# Patient Record
Sex: Male | Born: 1980 | Race: White | Hispanic: No | Marital: Married
Health system: Midwestern US, Academic
[De-identification: ages and names within clinical notes are randomized; demographics above are authoritative.]

## PROBLEM LIST (undated history)

## (undated) DIAGNOSIS — M5126 Other intervertebral disc displacement, lumbar region: Secondary | ICD-10-CM

## (undated) DIAGNOSIS — I10 Essential (primary) hypertension: Secondary | ICD-10-CM

## (undated) DIAGNOSIS — F419 Anxiety disorder, unspecified: Secondary | ICD-10-CM

## (undated) DIAGNOSIS — F32A Depression, unspecified: Secondary | ICD-10-CM

## (undated) DIAGNOSIS — F329 Major depressive disorder, single episode, unspecified: Secondary | ICD-10-CM

## (undated) DIAGNOSIS — J301 Allergic rhinitis due to pollen: Secondary | ICD-10-CM

## (undated) DIAGNOSIS — K219 Gastro-esophageal reflux disease without esophagitis: Secondary | ICD-10-CM

## (undated) HISTORY — PX: SHOULDER ARTHROCENTESIS: SUR47

## (undated) HISTORY — DX: Depression, unspecified: F32.A

## (undated) HISTORY — DX: Essential (primary) hypertension: I10

## (undated) HISTORY — PX: APPENDECTOMY: SHX54

## (undated) HISTORY — DX: Gastro-esophageal reflux disease without esophagitis: K21.9

## (undated) HISTORY — DX: Allergic rhinitis due to pollen: J30.1

## (undated) HISTORY — DX: Other intervertebral disc displacement, lumbar region: M51.26

## (undated) HISTORY — DX: Anxiety disorder, unspecified: F41.9

## (undated) HISTORY — DX: Major depressive disorder, single episode, unspecified: F32.9

## (undated) HISTORY — PX: KNEE SURGERY: SHX244

---

## 2011-01-10 DIAGNOSIS — F988 Other specified behavioral and emotional disorders with onset usually occurring in childhood and adolescence: Secondary | ICD-10-CM | POA: Insufficient documentation

## 2012-10-15 DIAGNOSIS — E663 Overweight: Secondary | ICD-10-CM | POA: Insufficient documentation

## 2014-06-07 DIAGNOSIS — I10 Essential (primary) hypertension: Secondary | ICD-10-CM | POA: Insufficient documentation

## 2014-06-07 DIAGNOSIS — E785 Hyperlipidemia, unspecified: Secondary | ICD-10-CM | POA: Insufficient documentation

## 2016-01-23 DIAGNOSIS — Z0000 Encounter for general adult medical examination without abnormal findings: Secondary | ICD-10-CM | POA: Insufficient documentation

## 2016-01-23 DIAGNOSIS — M79672 Pain in left foot: Secondary | ICD-10-CM | POA: Insufficient documentation

## 2016-07-03 DIAGNOSIS — F330 Major depressive disorder, recurrent, mild: Secondary | ICD-10-CM | POA: Insufficient documentation

## 2018-05-01 ENCOUNTER — Other Ambulatory Visit: Payer: Self-pay

## 2018-05-01 ENCOUNTER — Encounter (HOSPITAL_COMMUNITY): Payer: Self-pay | Admitting: Psychiatry

## 2018-05-01 ENCOUNTER — Ambulatory Visit (HOSPITAL_COMMUNITY): Payer: Self-pay | Admitting: Psychiatry

## 2018-05-01 ENCOUNTER — Ambulatory Visit (INDEPENDENT_AMBULATORY_CARE_PROVIDER_SITE_OTHER): Payer: BLUE CROSS/BLUE SHIELD | Admitting: Psychiatry

## 2018-05-01 VITALS — BP 124/78 | HR 83 | Ht 71.0 in | Wt 180.0 lb

## 2018-05-01 DIAGNOSIS — F331 Major depressive disorder, recurrent, moderate: Secondary | ICD-10-CM

## 2018-05-01 DIAGNOSIS — F411 Generalized anxiety disorder: Secondary | ICD-10-CM

## 2018-05-01 DIAGNOSIS — F900 Attention-deficit hyperactivity disorder, predominantly inattentive type: Secondary | ICD-10-CM | POA: Diagnosis not present

## 2018-05-01 DIAGNOSIS — F5102 Adjustment insomnia: Secondary | ICD-10-CM

## 2018-05-01 MED ORDER — BUPROPION HCL ER (XL) 300 MG PO TB24: 300.0000 mg | ORAL_TABLET | Freq: Every day | ORAL | 0 refills | Status: DC

## 2018-05-01 MED ORDER — BUPROPION HCL ER (XL) 150 MG PO TB24: 150.0000 mg | ORAL_TABLET | Freq: Every day | ORAL | 0 refills | Status: DC

## 2018-05-01 NOTE — Progress Notes (Signed)
Psychiatric Initial Adult Assessment   Patient Identification: James Sloan MRN:  [id number] Date of Evaluation:  05/01/2018 Referral Source: self referral Chief Complaint:   Chief Complaint    Establish Care; Depression; Anxiety     Visit Diagnosis:    ICD-10-CM   1. Major depressive disorder, recurrent episode, moderate (HCC) F33.1   2. GAD (generalized anxiety disorder) F41.1   3. Adjustment insomnia F51.02   4. Attention deficit hyperactivity disorder (ADHD), predominantly inattentive type F90.0     History of Present Illness:  37 years old white married male. Relocated from [place] , developing care. Had psychiatrist in [place] and diagnosed with depression, anxiety, adhd and insomnia  Wife has a job in [place] for which he had moved he has not yet found a job. He has 2 kids and takes care of them, he feels balanced on the current medication of Wellbutrin 450 mg takes trazodone for sleep. He has been diagnosed with ADHD when he was in college and he has gone through psychological testing has been on Vyvanse for the last 3 years since her Vyvanse dose to 50 mg does help that inattention otherwise he gets distracted was having difficulty in learning and college and falling grades.  History suggestive of around in 2007 he has suffered from depression that lasted for more than 2 weeks including decreased energy decreased interest decreased motivation feeling thoughts down and depressed but not hopeless to the point of suicidal thoughts or having crying spells   He does endorse worries in the past excessive and diagnoses generalized anxiety possibility and has been on different medications SSRI but now he is on Klonopin when necessary and also Vistaril when necessary face that SSRIs did not include and it would cause side effects included decreased libido  He feels balanced and the Wellbutrin. He does understand Vyvanse can affect by its stimulating effect to anxiety and blood  pressure.  Duration more than 10 years  Modifying factor wife and kids  Activity factors as of now no job. Relocation.  Severity of depression is fair and balanced   No side effects reported  not using vyvanse or klonopine regularly Has one month refill on all meds for now    Associated Signs/Symptoms: Depression Symptoms:  anxiety, (Hypo) Manic Symptoms:  Distractibility, Anxiety Symptoms:  Excessive Worry, Psychotic Symptoms:  denies PTSD Symptoms: NA  Past Psychiatric History: depression , anxiety  Previous Psychotropic Medications: Yes   Substance Abuse History in the last 12 months:  No.  Consequences of Substance Abuse: NA  Past Medical History: History reviewed. No pertinent past medical history. History reviewed. No pertinent surgical history.  Family Psychiatric History: denies   Family History: History reviewed. No pertinent family history.  Social History:   Social History   Socioeconomic History  . Marital status: Married    Spouse name: Not on file  . Number of children: Not on file  . Years of education: Not on file  . Highest education level: Not on file  Occupational History  . Not on file  Social Needs  . Financial resource strain: Not on file  . Food insecurity:    Worry: Not on file    Inability: Not on file  . Transportation needs:    Medical: Not on file    Non-medical: Not on file  Tobacco Use  . Smoking status: Not on file  Substance and Sexual Activity  . Alcohol use: Yes    Alcohol/week: 1.2 - 2.4 oz  Types: 1 - 2 Glasses of wine, 1 - 2 Cans of beer per week    Comment: a few drinks per week  . Drug use: Not Currently    Types: Marijuana  . Sexual activity: Not on file  Lifestyle  . Physical activity:    Days per week: Not on file    Minutes per session: Not on file  . Stress: Not on file  Relationships  . Social connections:    Talks on phone: Not on file    Gets together: Not on file    Attends religious  service: Not on file    Active member of club or organization: Not on file    Attends meetings of clubs or organizations: Not on file    Relationship status: Not on file  Other Topics Concern  . Not on file  Social History Narrative  . Not on file    Additional Social History: grew up with parents. Fine . No trauma. Married for 9 years   Allergies:   Allergies  Allergen Reactions  . Iodinated Diagnostic Agents Hives  . Iodine Hives    Metabolic Disorder Labs: No results found for: HGBA1C, MPG No results found for: PROLACTIN No results found for: CHOL, TRIG, HDL, CHOLHDL, VLDL, LDLCALC   Current Medications: Current Outpatient Medications  Medication Sig Dispense Refill  . buPROPion (WELLBUTRIN XL) 150 MG 24 hr tablet Take 1 tablet (150 mg total) by mouth daily. 30 tablet 0  . buPROPion (WELLBUTRIN XL) 300 MG 24 hr tablet Take 1 tablet (300 mg total) by mouth daily. 30 tablet 0  . CANNABIDIOL PO Take 15 mg by mouth 1 day or 1 dose.    . clonazePAM (KLONOPIN) 0.5 MG tablet Take 0.5 mg by mouth as needed for anxiety.    . hydrOXYzine (ATARAX/VISTARIL) 25 MG tablet Take 25 mg by mouth as needed.    Marland Kitchen lisdexamfetamine (VYVANSE) 30 MG capsule Take by mouth.    . losartan (COZAAR) 100 MG tablet Take 100 mg by mouth daily.    . meloxicam (MOBIC) 15 MG tablet Take 15 mg by mouth daily as needed.  0  . pantoprazole (PROTONIX) 40 MG tablet Take one tablet by mouth every day    . traZODone (DESYREL) 50 MG tablet Take by mouth.     No current facility-administered medications for this visit.     Neurologic: Headache: No Seizure: No Paresthesias:No  Musculoskeletal: Strength & Muscle Tone: within normal limits Gait & Station: normal Patient leans: no lean  Psychiatric Specialty Exam: Review of Systems  Cardiovascular: Negative for chest pain.  Psychiatric/Behavioral: Negative for suicidal ideas.    Blood pressure 124/78, pulse 83, height 5\' 11"  (1.803 m), weight 180 lb  (81.6 kg).Body mass index is 25.1 kg/m.  General Appearance: Casual  Eye Contact:  Fair  Speech:  Slow  Volume:  Normal  Mood:  fair  Affect:  Congruent  Thought Process:  Goal Directed  Orientation:  Full (Time, Place, and Person)  Thought Content:  Rumination  Suicidal Thoughts:  No  Homicidal Thoughts:  No  Memory:  Immediate;   Fair Recent;   Fair  Judgement:  Good  Insight:  Good  Psychomotor Activity:  Normal  Concentration:  Concentration: Fair and Attention Span: Fair  Recall:  [employer] of Knowledge:Fair  Language: Fair  Akathisia:  Negative  Handed:  Right  AIMS (if indicated):    Assets:  Desire for Improvement  ADL's:  Intact  Cognition:  WNL  Sleep:  Fair on meds    Treatment Plan Summary: Medication management and Plan as follows  1. Major depressive disorder, mild in remission: continue wellbutrin will give extra refill. He just filled for one month as well Total is 300 plus 150mg   2. GAD: takes vistaril or klonopine prn. Discussed to avoid using klonopine and if needed can consider zoloft small dose next visit  says he seldom uses klonopine  3. Insomnia: reviewed sleep hygiene. Take prn trazadone  4. ADHD; diagnosed by psychological testing in college and being prescribed vyvase by psychiatrist in [place]. Has one month supply. Encouraged not to take as of now regularly since not working and its effect on anxiety, sleep and possible blood pressure  Discussed to add more water and exercises to the day Reviewed meds, concerns and side effect  he wants to follow 6w with me on the Saturday clinic  can call earlier for concerns or refills  More than 50% time spent in counseling and coordination of care including patient education and review side effects and concerns were addressed     Merian Capron, MD 6/1/201910:14 AM

## 2018-06-09 ENCOUNTER — Other Ambulatory Visit: Payer: Self-pay

## 2018-06-09 ENCOUNTER — Ambulatory Visit (INDEPENDENT_AMBULATORY_CARE_PROVIDER_SITE_OTHER): Payer: BLUE CROSS/BLUE SHIELD | Admitting: Family Medicine

## 2018-06-09 ENCOUNTER — Encounter: Payer: Self-pay | Admitting: Family Medicine

## 2018-06-09 VITALS — BP 110/88 | HR 86 | Temp 98.3°F | Ht 71.0 in | Wt 176.0 lb

## 2018-06-09 DIAGNOSIS — Z7689 Persons encountering health services in other specified circumstances: Secondary | ICD-10-CM | POA: Diagnosis not present

## 2018-06-09 DIAGNOSIS — F419 Anxiety disorder, unspecified: Secondary | ICD-10-CM

## 2018-06-09 DIAGNOSIS — I10 Essential (primary) hypertension: Secondary | ICD-10-CM | POA: Diagnosis not present

## 2018-06-09 DIAGNOSIS — K219 Gastro-esophageal reflux disease without esophagitis: Secondary | ICD-10-CM

## 2018-06-09 DIAGNOSIS — F32A Depression, unspecified: Secondary | ICD-10-CM

## 2018-06-09 DIAGNOSIS — F329 Major depressive disorder, single episode, unspecified: Secondary | ICD-10-CM

## 2018-06-09 DIAGNOSIS — D229 Melanocytic nevi, unspecified: Secondary | ICD-10-CM

## 2018-06-09 MED ORDER — LOSARTAN POTASSIUM 100 MG PO TABS: 100.0000 mg | ORAL_TABLET | Freq: Every day | ORAL | 1 refills | Status: DC

## 2018-06-09 MED ORDER — PANTOPRAZOLE SODIUM 40 MG PO TBEC: 40.0000 mg | DELAYED_RELEASE_TABLET | Freq: Every day | ORAL | 1 refills | Status: DC

## 2018-06-09 NOTE — Progress Notes (Signed)
Patient presents to clinic today to establish care.  SUBJECTIVE: PMH: Pt is a 37 yo male with pmh hx sig for HTN, GERD, anxiety, depression, ADHD, insomnia.  Pt was previously seen in [place].  He moved recently moved to the area.  HTN: -taking losartan 100 mg daily -Checking BP at home typically 110-115/70-85 -Patient trying to stay active.  Went hiking last weekend. -Does not utilize salt  GERD: -Taking Protonix 40 mg daily -Prior to medication noted symptoms of all foods.  anxiety and depression: -Endorses good mood, and good energy.  Sleep is improving. -Has been dealing with depression for the last 11 years. -Patient is taking trazodone 50 mg for sleep. -Recently established with Dr. De Nurse at the behavioral [facility] -Wellbutrin XL 450 mg, Vistaril 25 mg as needed, Klonopin 0.5 mg as needed   Skin concerns: -Patient endorses multiple moles on his back which he is interested in having removed. -They do not itch or bleed.  Allergies: Iodine-hives, throat edema  Social history: Pt is married.  He and his family moved from [place].  Pt has a 11-year-old son who is present with him this visit.  Pt currently stays at home taking care of his son.  Prior to moving patient worked in [employer] for [employer].  Pt denies tobacco or drug use.  Pt endorses having 3 drinks per week.   Past Medical History:  Diagnosis Date  . Anxiety   . Depression   . GERD (gastroesophageal reflux disease)   . Hay fever   . Hypertension     Past Surgical History:  Procedure Laterality Date  . APPENDECTOMY    . KNEE SURGERY Left   . SHOULDER ARTHROCENTESIS Left     Current Outpatient Medications on File Prior to Visit  Medication Sig Dispense Refill  . buPROPion (WELLBUTRIN XL) 150 MG 24 hr tablet Take 1 tablet (150 mg total) by mouth daily. 30 tablet 0  . buPROPion (WELLBUTRIN XL) 300 MG 24 hr tablet Take 1 tablet (300 mg total) by mouth daily. 30  tablet 0  . CANNABIDIOL PO Take 15 mg by mouth 1 day or 1 dose.    . hydrOXYzine (ATARAX/VISTARIL) 25 MG tablet Take 25 mg by mouth as needed.    Marland Kitchen lisdexamfetamine (VYVANSE) 30 MG capsule Take by mouth.    . traZODone (DESYREL) 50 MG tablet Take by mouth.     No current facility-administered medications on file prior to visit.     Allergies  Allergen Reactions  . Iodinated Diagnostic Agents Hives  . Iodine Hives    Family History  Problem Relation Age of Onset  . Hyperlipidemia Father   . Cancer Maternal Grandmother   . ADD / ADHD Maternal Grandmother   . Cancer Maternal Grandfather   . ADD / ADHD Maternal Grandfather   . Hyperlipidemia Paternal Grandmother   . Stroke Paternal Grandfather   . Hyperlipidemia Paternal Grandfather     Social History   Socioeconomic History  . Marital status: Married    Spouse name: Not on file  . Number of children: Not on file  . Years of education: Not on file  . Highest education level: Not on file  Occupational History  . Not on file  Social Needs  . Financial resource strain: Not on file  . Food insecurity:    Worry: Not on file    Inability: Not on file  . Transportation needs:    Medical: Not on file  Non-medical: Not on file  Tobacco Use  . Smoking status: Never Smoker  . Smokeless tobacco: Never Used  Substance and Sexual Activity  . Alcohol use: Yes    Alcohol/week: 1.2 - 2.4 oz    Types: 1 - 2 Glasses of wine, 1 - 2 Cans of beer per week    Comment: a few drinks per week  . Drug use: Not Currently    Types: Marijuana  . Sexual activity: Yes  Lifestyle  . Physical activity:    Days per week: Not on file    Minutes per session: Not on file  . Stress: Not on file  Relationships  . Social connections:    Talks on phone: Not on file    Gets together: Not on file    Attends religious service: Not on file    Active member of club or organization: Not on file    Attends meetings of clubs or organizations: Not on  file    Relationship status: Not on file  . Intimate partner violence:    Fear of current or ex partner: Not on file    Emotionally abused: Not on file    Physically abused: Not on file    Forced sexual activity: Not on file  Other Topics Concern  . Not on file  Social History Narrative  . Not on file    ROS General: Denies fever, chills, night sweats, changes in weight, changes in appetite HEENT: Denies headaches, ear pain, changes in vision, rhinorrhea, sore throat CV: Denies CP, palpitations, SOB, orthopnea Pulm: Denies SOB, cough, wheezing GI: Denies abdominal pain, nausea, vomiting, diarrhea, constipation GU: Denies dysuria, hematuria, frequency, vaginal discharge Msk: Denies muscle cramps, joint pains Neuro: Denies weakness, numbness, tingling Skin: Denies rashes, bruising Psych: Denies hallucinations  + anxiety and depression  BP 110/88 (BP Location: Left Arm, Patient Position: Sitting, Cuff Size: Normal)   Pulse 86   Temp 98.3 F (36.8 C) (Oral)   Ht 5\' 11"  (1.803 m)   Wt 176 lb (79.8 kg)   SpO2 98%   BMI 24.55 kg/m   Physical Exam Gen. Pleasant, well developed, well-nourished, in NAD HEENT - [place]/AT, PERRL, no scleral icterus, no nasal drainage, pharynx without erythema or exudate.  TMs normal bilaterally.  No cervical lymphadenopathy. Lungs: no use of accessory muscles,  CTAB, no wheezes, rales or rhonchi Cardiovascular: RRR, No r/g/m, no peripheral edema Abdomen: BS present, soft, nontender, nondistended Neuro:  A&Ox3, CN II-XII intact, normal gait Skin:  Warm, dry, intact, no lesions.  Several nevi on back, R flank nevus with irregular border, <88mm  No results found for this or any previous visit (from the past 2160 hour(s)).  Assessment/Plan: Essential hypertension -Controlled -Continue losartan 100 mg daily.  Refill provided -Continue lifestyle modifications -Continue checking BP at home  Gastroesophageal reflux disease, esophagitis presence not  specified -Continue pantoprazole 40 mg daily.  refill provided -Given a list of foods to avoid that are known to cause GERD symptoms  Nevus  - Plan: Ambulatory referral to Dermatology  Anxiety and depression -Continue Wellbutrin XL 450 mg, Atarax 25 mg as needed, Klonopin 0.5 mg as needed, trazodone 50 mg nightly -Continue to follow up with Dr. De Nurse  -PHQ 9 score 6 -Gad 7 score 3  Encounter to establish care -We reviewed the PMH, PSH, FH, SH, Meds and Allergies. -We provided refills for any medications we will prescribe as needed. -We addressed current concerns per orders and patient instructions. -We have asked for  records for pertinent exams, studies, vaccines and notes from previous providers. -We have advised patient to follow up per instructions below.  Follow-up in the next few weeks to months for CPE  Grier Mitts, MD

## 2018-06-09 NOTE — Patient Instructions (Signed)
Food Choices for Gastroesophageal Reflux Disease, Adult When you have gastroesophageal reflux disease (GERD), the foods you eat and your eating habits are very important. Choosing the right foods can help ease the discomfort of GERD. Consider working with a diet and nutrition specialist (dietitian) to help you make healthy food choices. What general guidelines should I follow? Eating plan  Choose healthy foods low in fat, such as fruits, vegetables, whole grains, low-fat dairy products, and lean meat, fish, and poultry.  Eat frequent, small meals instead of three large meals each day. Eat your meals slowly, in a relaxed setting. Avoid bending over or lying down until 2-3 hours after eating.  Limit high-fat foods such as fatty meats or fried foods.  Limit your intake of oils, butter, and shortening to less than 8 teaspoons each day.  Avoid the following: ? Foods that cause symptoms. These may be different for different people. Keep a food diary to keep track of foods that cause symptoms. ? Alcohol. ? Drinking large amounts of liquid with meals. ? Eating meals during the 2-3 hours before bed.  Cook foods using methods other than frying. This may include baking, grilling, or broiling. Lifestyle   Maintain a healthy weight. Ask your health care provider what weight is healthy for you. If you need to lose weight, work with your health care provider to do so safely.  Exercise for at least 30 minutes on 5 or more days each week, or as told by your health care provider.  Avoid wearing clothes that fit tightly around your waist and chest.  Do not use any products that contain nicotine or tobacco, such as cigarettes and e-cigarettes. If you need help quitting, ask your health care provider.  Sleep with the head of your bed raised. Use a wedge under the mattress or blocks under the bed frame to raise the head of the bed. What foods are not recommended? The items listed may not be a complete  list. Talk with your dietitian about what dietary choices are best for you. Grains Pastries or quick breads with added fat. French toast. Vegetables Deep fried vegetables. French fries. Any vegetables prepared with added fat. Any vegetables that cause symptoms. For some people this may include tomatoes and tomato products, chili peppers, onions and garlic, and horseradish. Fruits Any fruits prepared with added fat. Any fruits that cause symptoms. For some people this may include citrus fruits, such as oranges, grapefruit, pineapple, and lemons. Meats and other protein foods High-fat meats, such as fatty beef or pork, hot dogs, ribs, ham, sausage, salami and bacon. Fried meat or protein, including fried fish and fried chicken. Nuts and nut butters. Dairy Whole milk and chocolate milk. Sour cream. Cream. Ice cream. Cream cheese. Milk shakes. Beverages Coffee and tea, with or without caffeine. Carbonated beverages. Sodas. Energy drinks. Fruit juice made with acidic fruits (such as orange or grapefruit). Tomato juice. Alcoholic drinks. Fats and oils Butter. Margarine. Shortening. Ghee. Sweets and desserts Chocolate and cocoa. Donuts. Seasoning and other foods Pepper. Peppermint and spearmint. Any condiments, herbs, or seasonings that cause symptoms. For some people, this may include curry, hot sauce, or vinegar-based salad dressings. Summary  When you have gastroesophageal reflux disease (GERD), food and lifestyle choices are very important to help ease the discomfort of GERD.  Eat frequent, small meals instead of three large meals each day. Eat your meals slowly, in a relaxed setting. Avoid bending over or lying down until 2-3 hours after eating.  Limit high-fat   foods such as fatty meat or fried foods. This information is not intended to replace advice given to you by your health care provider. Make sure you discuss any questions you have with your health care provider. Document Released:  11/17/2005 Document Revised: 11/18/2016 Document Reviewed: 11/18/2016 Elsevier Interactive Patient Education  2018 [employer]  Mole A mole is a colored (pigmented) growth on the skin. Moles are very common. They are usually harmless, but some moles can become cancerous over time. What are the causes? Moles occur when pigmented skin cells grow together in clusters instead of spreading out in the skin as they normally do. The reason why the skin cells grow together in clusters is not known. What are the signs or symptoms? A mole may be:  [place] or black.  Flat or raised.  Smooth or wrinkled.  How is this diagnosed? A mole is diagnosed with a skin exam. If your health care provider thinks a mole may be cancerous, a piece of the mole will be removed for testing. How is this treated? Treatment is not needed unless a mole is cancerous. If a mole is cancerous, it will be removed. If a mole is causing pain or you do not like the way it looks, you may choose to have it removed. Follow these instructions at home:  Every month, look for new moles and check your existing moles for changes. This is important because a change in a mole can mean that the mole has become cancerous. Look for changes in: ? Size. Look for moles that are more than  in (0.64 cm) wide (in diameter). ? Shape. Look for moles that are not round or oval. ? Borders. Look for moles that are not symmetrical. ? Color. Note that it is normal for moles to get darker during pregnancy or when you take birth control pills.  When you are outdoors, wear sunscreen with SPF 30 (sun protection factor 30) or higher. Reapply the sunscreen every 2-3 hours.  If you have a large number of moles, see a skin doctor (dermatologist) at least one time every year. Contact a health care provider if:  The size, shape, borders, or color of your mole change.  Your mole, or the skin near the mole, becomes painful, sore, red, or swollen.  Your  mole: ? Develops more than one color. ? Itches or bleeds. ? Becomes scaly, sheds skin, or oozes fluid. ? Becomes flat or develops raised areas. ? Becomes hard or soft.  You develop a new mole. This information is not intended to replace advice given to you by your health care provider. Make sure you discuss any questions you have with your health care provider. Document Released: 08/12/2001 Document Revised: 04/30/2016 Document Reviewed: 09/07/2015 Elsevier Interactive Patient Education  [employer].

## 2018-06-12 ENCOUNTER — Encounter (HOSPITAL_COMMUNITY): Payer: Self-pay | Admitting: Psychiatry

## 2018-06-12 ENCOUNTER — Other Ambulatory Visit: Payer: Self-pay

## 2018-06-12 ENCOUNTER — Ambulatory Visit (INDEPENDENT_AMBULATORY_CARE_PROVIDER_SITE_OTHER): Payer: BLUE CROSS/BLUE SHIELD | Admitting: Psychiatry

## 2018-06-12 VITALS — BP 122/84 | Wt 181.6 lb

## 2018-06-12 DIAGNOSIS — F331 Major depressive disorder, recurrent, moderate: Secondary | ICD-10-CM | POA: Diagnosis not present

## 2018-06-12 DIAGNOSIS — F411 Generalized anxiety disorder: Secondary | ICD-10-CM | POA: Diagnosis not present

## 2018-06-12 DIAGNOSIS — F5102 Adjustment insomnia: Secondary | ICD-10-CM

## 2018-06-12 MED ORDER — TRAZODONE HCL 50 MG PO TABS: 50.0000 mg | ORAL_TABLET | Freq: Every day | ORAL | 1 refills | Status: DC

## 2018-06-12 MED ORDER — BUPROPION HCL ER (XL) 300 MG PO TB24: 300.0000 mg | ORAL_TABLET | Freq: Every day | ORAL | 1 refills | Status: DC

## 2018-06-12 NOTE — Progress Notes (Signed)
[facility] Outpatient Follow up visit   Patient Identification: James Sloan MRN:  [id number] Date of Evaluation:  06/12/2018 Referral Source: self referral Chief Complaint:   Chief Complaint    Follow-up; Medication Refill     Visit Diagnosis:    ICD-10-CM   1. Major depressive disorder, recurrent episode, moderate (HCC) F33.1   2. GAD (generalized anxiety disorder) F41.1   3. Adjustment insomnia F51.02     History of Present Illness:  37 years old white married male. Relocated from [place] , developing care. Had psychiatrist in [place] and diagnosed with depression, anxiety, adhd and insomnia  Returns for fu after 6 weeks, doing fair on wellbutrin. Seldom uses vyvanse since not working and helping raise kids for now He wants to lower wellbutrin since he feels stable Not taking klonopine Still takes trazadone says tried to lower but had problems sleeping  Wife has a job in [place] for which he had moved he has not yet found a job. He has 2 kids and takes care of them,\  Duration more than 10 years  Modifying factor wife and kids  Activity factors as of now no job. Relocation.  Severity of depression is fair and balanced   No side effects reported   Substance Abuse History in the last 12 months:  No.  Consequences of Substance Abuse: NA  Past Medical History:  Past Medical History:  Diagnosis Date  . Anxiety   . Depression   . GERD (gastroesophageal reflux disease)   . Hay fever   . Hypertension     Past Surgical History:  Procedure Laterality Date  . APPENDECTOMY    . KNEE SURGERY Left   . SHOULDER ARTHROCENTESIS Left     Family Psychiatric History: denies   Family History:  Family History  Problem Relation Age of Onset  . Hyperlipidemia Father   . Cancer Maternal Grandmother   . ADD / ADHD Maternal Grandmother   . Cancer Maternal Grandfather   . ADD / ADHD Maternal Grandfather   . Hyperlipidemia Paternal Grandmother   . Stroke Paternal  Grandfather   . Hyperlipidemia Paternal Grandfather     Social History:   Social History   Socioeconomic History  . Marital status: Married    Spouse name: wittiney  . Number of children: 2  . Years of education: Not on file  . Highest education level: Bachelor's degree (e.g., BA, AB, BS)  Occupational History  . Not on file  Social Needs  . Financial resource strain: Not hard at all  . Food insecurity:    Worry: Never true    Inability: Never true  . Transportation needs:    Medical: No    Non-medical: No  Tobacco Use  . Smoking status: Never Smoker  . Smokeless tobacco: Never Used  Substance and Sexual Activity  . Alcohol use: Yes    Alcohol/week: 1.2 - 2.4 oz    Types: 1 - 2 Glasses of wine, 1 - 2 Cans of beer per week    Comment: a few drinks per week  . Drug use: Not Currently    Types: Marijuana  . Sexual activity: Yes  Lifestyle  . Physical activity:    Days per week: 3 days    Minutes per session: 60 min  . Stress: Not at all  Relationships  . Social connections:    Talks on phone: Once a week    Gets together: Never    Attends religious service: More than 4 times  per year    Active member of club or organization: Yes    Attends meetings of clubs or organizations: More than 4 times per year    Relationship status: Married  Other Topics Concern  . Not on file  Social History Narrative  . Not on file    Additional Social History: grew up with parents. Fine . No trauma. Married for 9 years   Allergies:   Allergies  Allergen Reactions  . Iodinated Diagnostic Agents Hives  . Iodine Hives    Metabolic Disorder Labs: No results found for: HGBA1C, MPG No results found for: PROLACTIN No results found for: CHOL, TRIG, HDL, CHOLHDL, VLDL, LDLCALC   Current Medications: Current Outpatient Medications  Medication Sig Dispense Refill  . buPROPion (WELLBUTRIN XL) 300 MG 24 hr tablet Take 1 tablet (300 mg total) by mouth daily. 30 tablet 1  .  CANNABIDIOL PO Take 15 mg by mouth 1 day or 1 dose.    . hydrOXYzine (ATARAX/VISTARIL) 25 MG tablet Take 25 mg by mouth as needed.    Marland Kitchen lisinopril-hydrochlorothiazide (PRINZIDE,ZESTORETIC) 10-12.5 MG tablet 10-12.5 tablets daily.    Marland Kitchen losartan (COZAAR) 100 MG tablet Take 1 tablet (100 mg total) by mouth daily. 30 tablet 1  . pantoprazole (PROTONIX) 40 MG tablet Take 1 tablet (40 mg total) by mouth daily. 30 tablet 1  . traZODone (DESYREL) 50 MG tablet Take 1 tablet (50 mg total) by mouth at bedtime. 30 tablet 1  . VYVANSE 50 MG capsule TAKE 1 CAPSULE BY MOUTH EVERY DAY IN THE MORNING  0   No current facility-administered medications for this visit.       Psychiatric Specialty Exam: Review of Systems  Cardiovascular: Negative for palpitations.  Psychiatric/Behavioral: Negative for suicidal ideas.    Blood pressure 122/84, weight 181 lb 9.6 oz (82.4 kg).Body mass index is 25.33 kg/m.  General Appearance: Casual  Eye Contact:  Fair  Speech:  Slow  Volume:  Normal  Mood:  fair  Affect:  Congruent  Thought Process:  Goal Directed  Orientation:  Full (Time, Place, and Person)  Thought Content:  Rumination  Suicidal Thoughts:  No  Homicidal Thoughts:  No  Memory:  Immediate;   Fair Recent;   Fair  Judgement:  Good  Insight:  Good  Psychomotor Activity:  Normal  Concentration:  Concentration: Fair and Attention Span: Fair  Recall:  [employer] of Knowledge:Fair  Language: Fair  Akathisia:  Negative  Handed:  Right  AIMS (if indicated):    Assets:  Desire for Improvement  ADL's:  Intact  Cognition: WNL  Sleep:  Fair on meds    Treatment Plan Summary: Medication management and Plan as follows  1. Major depressive disorder, mild in remission: remains in remission he wants to lower wellbutrin, I explained he may have recurrence of depression. Says stress factors are low Will lower to wellbutrin 300mg  only. Stop the 150mg    2. WUJ:[id number] . Seldom takes vistaril 3. Insomnia:  ongoing without trazadone. Will continue   4. ADHD; diagnosed by psychological testing in college. Seldom takes vyvanse. Says would need when he starts working  More than 50% time spent in counseling and coordination of care including patient education and review side effects and concerns were addressed   Fu 4-6 w or call earlier if symptoms worsen or recur  Merian Capron, MD 7/13/201910:03 AM

## 2018-07-06 ENCOUNTER — Other Ambulatory Visit (HOSPITAL_COMMUNITY): Payer: Self-pay | Admitting: Psychiatry

## 2018-07-13 ENCOUNTER — Ambulatory Visit (INDEPENDENT_AMBULATORY_CARE_PROVIDER_SITE_OTHER): Payer: BLUE CROSS/BLUE SHIELD | Admitting: Family Medicine

## 2018-07-13 ENCOUNTER — Encounter: Payer: Self-pay | Admitting: Family Medicine

## 2018-07-13 VITALS — BP 118/86 | HR 102 | Temp 98.5°F | Ht 71.0 in | Wt 182.0 lb

## 2018-07-13 DIAGNOSIS — Z13220 Encounter for screening for lipoid disorders: Secondary | ICD-10-CM

## 2018-07-13 DIAGNOSIS — I10 Essential (primary) hypertension: Secondary | ICD-10-CM | POA: Diagnosis not present

## 2018-07-13 DIAGNOSIS — Z131 Encounter for screening for diabetes mellitus: Secondary | ICD-10-CM

## 2018-07-13 DIAGNOSIS — Z0000 Encounter for general adult medical examination without abnormal findings: Secondary | ICD-10-CM

## 2018-07-13 NOTE — Progress Notes (Signed)
Subjective:  Pt is accompanied by his 2 young sons.   James Sloan is a 37 y.o. male and is here for a comprehensive physical exam. The patient reports no problems.  Compliant with losartan 100 mg daily.  Followed by Dr. De Nurse for depression, anxiety, insomnia.  Social History   Socioeconomic History  . Marital status: Married    Spouse name: James Sloan  . Number of children: 2  . Years of education: Not on file  . Highest education level: Bachelor's degree (e.g., BA, AB, BS)  Occupational History  . Not on file  Social Needs  . Financial resource strain: Not hard at all  . Food insecurity:    Worry: Never true    Inability: Never true  . Transportation needs:    Medical: No    Non-medical: No  Tobacco Use  . Smoking status: Never Smoker  . Smokeless tobacco: Never Used  Substance and Sexual Activity  . Alcohol use: Yes    Alcohol/week: 2.0 - 4.0 standard drinks    Types: 1 - 2 Glasses of wine, 1 - 2 Cans of beer per week    Comment: a few drinks per week  . Drug use: Not Currently    Types: Marijuana  . Sexual activity: Yes  Lifestyle  . Physical activity:    Days per week: 3 days    Minutes per session: 60 min  . Stress: Not at all  Relationships  . Social connections:    Talks on phone: Once a week    Gets together: Never    Attends religious service: More than 4 times per year    Active member of club or organization: Yes    Attends meetings of clubs or organizations: More than 4 times per year    Relationship status: Married  . Intimate partner violence:    Fear of current or ex partner: No    Emotionally abused: No    Physically abused: No    Forced sexual activity: No  Other Topics Concern  . Not on file  Social History Narrative  . Not on file   Health Maintenance  Topic Date Due  . HIV Screening  02/10/1995  . INFLUENZA VACCINE  07/01/2018  . TETANUS/TDAP  05/31/2023    The following portions of the patient's history were reviewed and  updated as appropriate: allergies, current medications, past family history, past medical history, past social history, past surgical history and problem list.  Review of Systems A comprehensive review of systems was negative.   Objective:    BP 118/86 (BP Location: Left Arm, Patient Position: Sitting, Cuff Size: Normal)   Pulse (!) 102   Temp 98.5 F (36.9 C) (Oral)   Ht 5\' 11"  (1.803 m)   Wt 182 lb (82.6 kg)   SpO2 98%   BMI 25.38 kg/m  General appearance: alert, cooperative, appears stated age and no distress Head: Normocephalic, without obvious abnormality, atraumatic Eyes: conjunctivae/corneas clear. PERRL, EOM's intact. Fundi benign. Ears: normal TM's and external ear canals both ears Nose: Nares normal. Septum midline. Mucosa normal. No drainage or sinus tenderness. Throat: lips, mucosa, and tongue normal; teeth and gums normal Neck: no adenopathy, no carotid bruit, no JVD, supple, symmetrical, trachea midline and thyroid not enlarged, symmetric, no tenderness/mass/nodules Lungs: clear to auscultation bilaterally Heart: regular rate and rhythm, S1, S2 normal, no murmur, click, rub or gallop Abdomen: soft, non-tender; bowel sounds normal; no masses,  no organomegaly Extremities: extremities normal, atraumatic, no cyanosis or  edema Skin: Skin color, texture, turgor normal. No rashes or lesions Neurologic: Alert and oriented X 3, normal strength and tone. Normal symmetric reflexes. Normal coordination and gait    Assessment:    Healthy male exam.      Plan:     Anticipatory guidance given including wearing seatbelts, smoke detectors in the home, increasing physical activity, increasing p.o. intake of water and vegetables. -labs ordered, pt to RTC this wk when fasting for lipid panel, CBC, BMP, Hgb A1C -immunizations up to date.  Recommend vaccine when available. See After Visit Summary for Counseling Recommendations   HTN -Controlled -Continue losartan 100 mg  daily  F/u prn in 6 months, sooner if needed.  Grier Mitts, MD

## 2018-07-13 NOTE — Patient Instructions (Addendum)
The lab is open each week day from 7:30 AM to 5:30 PM.  You can return to have her labs done at any time.  You should be fasting at least 8 hours when you do so.  Just stop by the front desk and tell them you are coming in for labs.  Preventive Care 18-39 Years, Male Preventive care refers to lifestyle choices and visits with your health care provider that can promote health and wellness. What does preventive care include?  A yearly physical exam. This is also called an annual well check.  Dental exams once or twice a year.  Routine eye exams. Ask your health care provider how often you should have your eyes checked.  Personal lifestyle choices, including: ? Daily care of your teeth and gums. ? Regular physical activity. ? Eating a healthy diet. ? Avoiding tobacco and drug use. ? Limiting alcohol use. ? Practicing safe sex. What happens during an annual well check? The services and screenings done by your health care provider during your annual well check will depend on your age, overall health, lifestyle risk factors, and family history of disease. Counseling Your health care provider may ask you questions about your:  Alcohol use.  Tobacco use.  Drug use.  Emotional well-being.  Home and relationship well-being.  Sexual activity.  Eating habits.  Work and work Statistician.  Screening You may have the following tests or measurements:  Height, weight, and BMI.  Blood pressure.  Lipid and cholesterol levels. These may be checked every 5 years starting at age 75.  Diabetes screening. This is done by checking your blood sugar (glucose) after you have not eaten for a while (fasting).  Skin check.  Hepatitis C blood test.  Hepatitis B blood test.  Sexually transmitted disease (STD) testing.  Discuss your test results, treatment options, and if necessary, the need for more tests with your health care provider. Vaccines Your health care provider may recommend  certain vaccines, such as:  Influenza vaccine. This is recommended every year.  Tetanus, diphtheria, and acellular pertussis (Tdap, Td) vaccine. You may need a Td booster every 10 years.  Varicella vaccine. You may need this if you have not been vaccinated.  HPV vaccine. If you are 28 or younger, you may need three doses over 6 months.  Measles, mumps, and rubella (MMR) vaccine. You may need at least one dose of MMR.You may also need a second dose.  Pneumococcal 13-valent conjugate (PCV13) vaccine. You may need this if you have certain conditions and have not been vaccinated.  Pneumococcal polysaccharide (PPSV23) vaccine. You may need one or two doses if you smoke cigarettes or if you have certain conditions.  Meningococcal vaccine. One dose is recommended if you are age 25-21 years and a first-year college student living in a residence hall, or if you have one of several medical conditions. You may also need additional booster doses.  Hepatitis A vaccine. You may need this if you have certain conditions or if you travel or work in places where you may be exposed to hepatitis A.  Hepatitis B vaccine. You may need this if you have certain conditions or if you travel or work in places where you may be exposed to hepatitis B.  Haemophilus influenzae type b (Hib) vaccine. You may need this if you have certain risk factors.  Talk to your health care provider about which screenings and vaccines you need and how often you need them. This information is not intended to  replace advice given to you by your health care provider. Make sure you discuss any questions you have with your health care provider. Document Released: 01/13/2002 Document Revised: 08/06/2016 Document Reviewed: 09/18/2015 Elsevier Interactive Patient Education  [employer].

## 2018-07-20 ENCOUNTER — Other Ambulatory Visit (HOSPITAL_COMMUNITY): Payer: Self-pay | Admitting: Psychiatry

## 2018-07-31 ENCOUNTER — Ambulatory Visit (INDEPENDENT_AMBULATORY_CARE_PROVIDER_SITE_OTHER): Payer: BLUE CROSS/BLUE SHIELD | Admitting: Psychiatry

## 2018-07-31 ENCOUNTER — Encounter (HOSPITAL_COMMUNITY): Payer: Self-pay | Admitting: Psychiatry

## 2018-07-31 VITALS — BP 128/74 | HR 80 | Ht 71.0 in | Wt 181.0 lb

## 2018-07-31 DIAGNOSIS — F900 Attention-deficit hyperactivity disorder, predominantly inattentive type: Secondary | ICD-10-CM

## 2018-07-31 DIAGNOSIS — F5102 Adjustment insomnia: Secondary | ICD-10-CM

## 2018-07-31 DIAGNOSIS — F331 Major depressive disorder, recurrent, moderate: Secondary | ICD-10-CM | POA: Diagnosis not present

## 2018-07-31 DIAGNOSIS — F411 Generalized anxiety disorder: Secondary | ICD-10-CM

## 2018-07-31 DIAGNOSIS — Z818 Family history of other mental and behavioral disorders: Secondary | ICD-10-CM

## 2018-07-31 MED ORDER — TRAZODONE HCL 50 MG PO TABS: 50.0000 mg | ORAL_TABLET | Freq: Every day | ORAL | 1 refills | Status: DC

## 2018-07-31 MED ORDER — VYVANSE 50 MG PO CAPS: ORAL_CAPSULE | ORAL | 0 refills | Status: DC

## 2018-07-31 MED ORDER — SERTRALINE HCL 50 MG PO TABS: 50.0000 mg | ORAL_TABLET | Freq: Every day | ORAL | 1 refills | Status: DC

## 2018-07-31 NOTE — Progress Notes (Signed)
[facility] Outpatient Follow up visit   Patient Identification: James Sloan MRN:  [id number] Date of Evaluation:  07/31/2018 Referral Source: self referral Chief Complaint:   Chief Complaint    Follow-up; Medication Refill     Visit Diagnosis:    ICD-10-CM   1. Major depressive disorder, recurrent episode, moderate (HCC) F33.1   2. GAD (generalized anxiety disorder) F41.1   3. Adjustment insomnia F51.02   4. Attention deficit hyperactivity disorder (ADHD), predominantly inattentive type F90.0     History of Present Illness:  37 years old white married male. Relocated from [place] , developing care. Had psychiatrist in [place] and diagnosed with depression, anxiety, adhd and insomnia  Returns for fu after 6 weeks, had cut down wellbutrin after last visit discussion self request to 300mg . Feeling somewhat low not down. Say prior it was better. But somewhat anxious. Wants to consider ssri to add instead of increaseing wellbutrin back Still takes trazadone says tried to lower but had problems sleeping  Wife has a job in [place] for which he had moved he has not yet found a job. He has 2 kids and takes care of them,\  Duration more than 10 years  Modifying factor: wife and kids Activity factors as of now no job. Relocation.  Severity of depression is fair and balanced   No side effects reported   Substance Abuse History in the last 12 months:  No.  Consequences of Substance Abuse: NA  Past Medical History:  Past Medical History:  Diagnosis Date  . Anxiety   . Depression   . GERD (gastroesophageal reflux disease)   . Hay fever   . Hypertension   . Lumbar herniated disc     Past Surgical History:  Procedure Laterality Date  . APPENDECTOMY    . KNEE SURGERY Left   . SHOULDER ARTHROCENTESIS Left     Family Psychiatric History: denies   Family History:  Family History  Problem Relation Age of Onset  . Hyperlipidemia Father   . Cancer Maternal Grandmother    . ADD / ADHD Maternal Grandmother   . Cancer Maternal Grandfather   . ADD / ADHD Maternal Grandfather   . Hyperlipidemia Paternal Grandmother   . Stroke Paternal Grandfather   . Hyperlipidemia Paternal Grandfather     Social History:   Social History   Socioeconomic History  . Marital status: Married    Spouse name: wittiney  . Number of children: 2  . Years of education: Not on file  . Highest education level: Bachelor's degree (e.g., BA, AB, BS)  Occupational History  . Not on file  Social Needs  . Financial resource strain: Not hard at all  . Food insecurity:    Worry: Never true    Inability: Never true  . Transportation needs:    Medical: No    Non-medical: No  Tobacco Use  . Smoking status: Never Smoker  . Smokeless tobacco: Never Used  Substance and Sexual Activity  . Alcohol use: Yes    Alcohol/week: 2.0 - 4.0 standard drinks    Types: 1 - 2 Glasses of wine, 1 - 2 Cans of beer per week    Comment: a few drinks per week  . Drug use: Not Currently    Types: Marijuana  . Sexual activity: Yes  Lifestyle  . Physical activity:    Days per week: 3 days    Minutes per session: 60 min  . Stress: Not at all  Relationships  . Social  connections:    Talks on phone: Once a week    Gets together: Never    Attends religious service: More than 4 times per year    Active member of club or organization: Yes    Attends meetings of clubs or organizations: More than 4 times per year    Relationship status: Married  Other Topics Concern  . Not on file  Social History Narrative  . Not on file    Additional Social History: grew up with parents. Fine . No trauma. Married for 9 years   Allergies:   Allergies  Allergen Reactions  . Iodinated Diagnostic Agents Hives  . Iodine Hives    Metabolic Disorder Labs: No results found for: HGBA1C, MPG No results found for: PROLACTIN No results found for: CHOL, TRIG, HDL, CHOLHDL, VLDL, LDLCALC   Current  Medications: Current Outpatient Medications  Medication Sig Dispense Refill  . buPROPion (WELLBUTRIN XL) 300 MG 24 hr tablet TAKE 1 TABLET BY MOUTH EVERY DAY 90 tablet 0  . CANNABIDIOL PO Take 15 mg by mouth 1 day or 1 dose.    . hydrOXYzine (ATARAX/VISTARIL) 25 MG tablet Take 25 mg by mouth as needed.    Marland Kitchen losartan (COZAAR) 100 MG tablet Take 1 tablet (100 mg total) by mouth daily. 30 tablet 1  . pantoprazole (PROTONIX) 40 MG tablet Take 1 tablet (40 mg total) by mouth daily. 30 tablet 1  . traZODone (DESYREL) 50 MG tablet Take 1 tablet (50 mg total) by mouth at bedtime. 30 tablet 1  . VYVANSE 50 MG capsule TAKE 1 CAPSULE BY MOUTH EVERY DAY IN THE MORNING 30 capsule 0  . sertraline (ZOLOFT) 50 MG tablet Take 1 tablet (50 mg total) by mouth daily. 30 tablet 1   No current facility-administered medications for this visit.       Psychiatric Specialty Exam: Review of Systems  Cardiovascular: Negative for chest pain and palpitations.  Psychiatric/Behavioral: Negative for suicidal ideas.    Blood pressure 128/74, pulse 80, height 5\' 11"  (1.803 m), weight 181 lb (82.1 kg).Body mass index is 25.24 kg/m.  General Appearance: Casual  Eye Contact:  Fair  Speech:  Slow  Volume:  Normal  Mood: fair  Affect:  Congruent  Thought Process:  Goal Directed  Orientation:  Full (Time, Place, and Person)  Thought Content:  Rumination  Suicidal Thoughts:  No  Homicidal Thoughts:  No  Memory:  Immediate;   Fair Recent;   Fair  Judgement:  Good  Insight:  Good  Psychomotor Activity:  Normal  Concentration:  Concentration: Fair and Attention Span: Fair  Recall:  [employer] of Knowledge:Fair  Language: Fair  Akathisia:  Negative  Handed:  Right  AIMS (if indicated):    Assets:  Desire for Improvement  ADL's:  Intact  Cognition: WNL  Sleep:  Fair on meds    Treatment Plan Summary: Medication management and Plan as follows  1. Major depressive disorder, mild in remission: somewhat feeling  low continue wellbutrin 300mg . Add zoloft 50mg     2. EXB:MWUX anxiety, add zoloft to 50mg  3. Insomnia: ongoing without trazadone. Will continue   4. ADHD; diagnosed by psychological testing in college. Seldom takes vyvanse.says need refill as he searches for job Will renew More than 50% time spent in counseling and coordination of care including patient education and review side effects and concerns were addressed   Fu 4-6 w or call earlier if symptoms worsen or recur  Merian Capron, MD 8/31/20199:11 AM

## 2018-08-09 ENCOUNTER — Other Ambulatory Visit (INDEPENDENT_AMBULATORY_CARE_PROVIDER_SITE_OTHER): Payer: BLUE CROSS/BLUE SHIELD

## 2018-08-09 DIAGNOSIS — Z13220 Encounter for screening for lipoid disorders: Secondary | ICD-10-CM

## 2018-08-09 DIAGNOSIS — Z0000 Encounter for general adult medical examination without abnormal findings: Secondary | ICD-10-CM | POA: Diagnosis not present

## 2018-08-09 DIAGNOSIS — Z131 Encounter for screening for diabetes mellitus: Secondary | ICD-10-CM | POA: Diagnosis not present

## 2018-08-09 DIAGNOSIS — I10 Essential (primary) hypertension: Secondary | ICD-10-CM

## 2018-08-09 LAB — BASIC METABOLIC PANEL
BUN: 14 mg/dL (ref 6–23)
CO2: 32 mEq/L (ref 19–32)
Calcium: 9.6 mg/dL (ref 8.4–10.5)
Chloride: 101 mEq/L (ref 96–112)
Creatinine, Ser: 0.97 mg/dL (ref 0.40–1.50)
GFR: 91.82 mL/min (ref >60.00)
Glucose, Bld: 90 mg/dL (ref 70–99)
Potassium: 4 mEq/L (ref 3.5–5.1)
Sodium: 139 mEq/L (ref 135–145)

## 2018-08-09 LAB — CBC WITH DIFFERENTIAL/PLATELET
Basophils Absolute: 0 10*3/uL (ref 0.0–0.1)
Basophils Relative: 0.5 % (ref 0.0–3.0)
Eosinophils Absolute: 0 10*3/uL (ref 0.0–0.7)
Eosinophils Relative: 0.7 % (ref 0.0–5.0)
HCT: 42.1 % (ref 39.0–52.0)
Hemoglobin: 14.7 g/dL (ref 13.0–17.0)
Lymphocytes Relative: 42.2 % (ref 12.0–46.0)
Lymphs Abs: 1.8 10*3/uL (ref 0.7–4.0)
MCHC: 34.9 g/dL (ref 30.0–36.0)
MCV: 86 fl (ref 78.0–100.0)
Monocytes Absolute: 0.3 10*3/uL (ref 0.1–1.0)
Monocytes Relative: 6.9 % (ref 3.0–12.0)
Neutro Abs: 2.1 10*3/uL (ref 1.4–7.7)
Neutrophils Relative %: 49.7 % (ref 43.0–77.0)
Platelets: 198 10*3/uL (ref 150.0–400.0)
RBC: 4.9 Mil/uL (ref 4.22–5.81)
RDW: 12.9 % (ref 11.5–15.5)
WBC: 4.2 10*3/uL (ref 4.0–10.5)

## 2018-08-09 LAB — LIPID PANEL
Cholesterol: 196 mg/dL (ref 0–200)
HDL: 42.8 mg/dL (ref >39.00)
LDL Cholesterol: 114 mg/dL — ABNORMAL HIGH (ref 0–99)
NonHDL: 153.59
Total CHOL/HDL Ratio: 5
Triglycerides: 197 mg/dL — ABNORMAL HIGH (ref 0.0–149.0)
VLDL: 39.4 mg/dL (ref 0.0–40.0)

## 2018-08-09 LAB — HEMOGLOBIN A1C: Hgb A1c MFr Bld: 5.1 % (ref 4.6–6.5)

## 2018-08-11 ENCOUNTER — Encounter: Payer: Self-pay | Admitting: Family Medicine

## 2018-08-22 ENCOUNTER — Other Ambulatory Visit (HOSPITAL_COMMUNITY): Payer: Self-pay | Admitting: Psychiatry

## 2018-09-13 ENCOUNTER — Telehealth (HOSPITAL_COMMUNITY): Payer: Self-pay

## 2018-09-13 ENCOUNTER — Ambulatory Visit (INDEPENDENT_AMBULATORY_CARE_PROVIDER_SITE_OTHER): Payer: BLUE CROSS/BLUE SHIELD | Admitting: Family Medicine

## 2018-09-13 ENCOUNTER — Encounter: Payer: Self-pay | Admitting: Family Medicine

## 2018-09-13 VITALS — BP 130/82 | HR 90 | Temp 98.4°F | Wt 183.0 lb

## 2018-09-13 DIAGNOSIS — M79645 Pain in left finger(s): Secondary | ICD-10-CM | POA: Diagnosis not present

## 2018-09-13 DIAGNOSIS — M25532 Pain in left wrist: Secondary | ICD-10-CM | POA: Diagnosis not present

## 2018-09-13 DIAGNOSIS — Z23 Encounter for immunization: Secondary | ICD-10-CM | POA: Diagnosis not present

## 2018-09-13 DIAGNOSIS — G8929 Other chronic pain: Secondary | ICD-10-CM

## 2018-09-13 MED ORDER — VYVANSE 50 MG PO CAPS: ORAL_CAPSULE | ORAL | 0 refills | Status: DC

## 2018-09-13 NOTE — Telephone Encounter (Signed)
Printed for pickup.

## 2018-09-13 NOTE — Progress Notes (Signed)
Subjective:    Patient ID: James Sloan, male    DOB: 16-Aug-1981, 37 y.o.   MRN: [id number]  No chief complaint on file.   HPI Patient was seen today for ongoing concern.  Pt endorses fall on outstretched hands approximately 1 month ago.  Pt notes he tripped and grabbed a counter with his hands to help ease his fall.  Pt notes pain in L thumb and mild swelling since the incident.  Pain now noted with certain movements (extension of thumb) such as trying to put on a backpack.  Pt notes occasional clicking and feeling like his thumb gets stuck.  Pt notes prior history of broken left hand, shattered left fifth digit, and other injuries as a teen.  Past Medical History:  Diagnosis Date  . Anxiety   . Depression   . GERD (gastroesophageal reflux disease)   . Hay fever   . Hypertension   . Lumbar herniated disc     Allergies  Allergen Reactions  . Iodinated Diagnostic Agents Hives  . Iodine Hives    ROS General: Denies fever, chills, night sweats, changes in weight, changes in appetite HEENT: Denies headaches, ear pain, changes in vision, rhinorrhea, sore throat CV: Denies CP, palpitations, SOB, orthopnea Pulm: Denies SOB, cough, wheezing GI: Denies abdominal pain, nausea, vomiting, diarrhea, constipation GU: Denies dysuria, hematuria, frequency, vaginal discharge Msk: Denies muscle cramps, joint pains  +L thumb pain Neuro: Denies weakness, numbness, tingling Skin: Denies rashes, bruising Psych: Denies depression, anxiety, hallucinations     Objective:    Blood pressure 130/82, pulse 90, temperature 98.4 F (36.9 C), temperature source Oral, weight 183 lb (83 kg), SpO2 98 %.   Gen. Pleasant, well-nourished, in no distress, normal affect   Lungs: no accessory muscle use Cardiovascular: RRR, no m/r/g, no peripheral edema Musculoskeletal: TTP of L thumb in anatomical snuff box and at extensor pollicis brevis.  No edema, no deformities, no cyanosis or clubbing, normal  tone Neuro:  A&Ox3, CN II-XII intact, normal gait   Wt Readings from Last 3 Encounters:  09/13/18 183 lb (83 kg)  07/13/18 182 lb (82.6 kg)  06/09/18 176 lb (79.8 kg)    Lab Results  Component Value Date   WBC 4.2 08/09/2018   HGB 14.7 08/09/2018   HCT 42.1 08/09/2018   PLT 198.0 08/09/2018   GLUCOSE 90 08/09/2018   CHOL 196 08/09/2018   TRIG 197.0 (H) 08/09/2018   HDL 42.80 08/09/2018   LDLCALC 114 (H) 08/09/2018   NA 139 08/09/2018   K 4.0 08/09/2018   CL 101 08/09/2018   CREATININE 0.97 08/09/2018   BUN 14 08/09/2018   CO2 32 08/09/2018   HGBA1C 5.1 08/09/2018    Assessment/Plan:  Left wrist pain  -Discussed possible causes including wrist fracture -We will obtain x-ray -Discussed NSAIDs, ice, rest. -Will obtain thumb spica splint. - Plan: DG Wrist Complete Left  Chronic pain of left thumb  - Plan: DG Wrist Complete Left  Need for influenza vaccination -Influenza vaccine given this visit  F/u prn.  Pt to get xray tomorrow am as tech not available this afternoon.  Grier Mitts, MD

## 2018-09-13 NOTE — Telephone Encounter (Signed)
Patient called for a refill on Vyvanse 50mg  tabs. Patient said that he would stop by office to pick up presciption. Next appointment is scheduled for 10-16-18

## 2018-09-13 NOTE — Patient Instructions (Signed)
Wrist Pain, Adult  There are many things that can cause wrist pain. Some common causes include:   An injury to the wrist area, such as a sprain, strain, or fracture.   Overuse of the joint.   A condition that causes increased pressure on a nerve in the wrist (carpal tunnel syndrome).   Wear and tear of the joints that occurs with aging (osteoarthritis).   A variety of other types of arthritis.    Sometimes, the cause of wrist pain is not known. Often, the pain goes away when you follow instructions from your health care provider for relieving pain at home, such as resting or icing the wrist. If your wrist pain continues, it is important to tell your health care provider.  Follow these instructions at home:   Rest the wrist area for at least 48 hours or as long as told by your health care provider.   If a splint or elastic bandage has been applied, use it as told by your health care provider.  ? Remove the splint or bandage only as told by your health care provider.  ? Loosen the splint or bandage if your fingers tingle, become numb, or turn cold or blue.   If directed, apply ice to the injured area.  ? If you have a removable splint or elastic bandage, remove it as told by your health care provider.  ? Put ice in a plastic bag.  ? Place a towel between your skin and the bag or between your splint or bandage and the bag.  ? Leave the ice on for 20 minutes, 2-3 times a day.   Keep your arm raised (elevated) above the level of your heart while you are sitting or lying down.   Take over-the-counter and prescription medicines only as told by your health care provider.   Keep all follow-up visits as told by your health care provider. This is important.  Contact a health care provider if:   You have a sudden sharp pain in the wrist, hand, or arm that is different or new.   The swelling or bruising on your wrist or hand gets worse.   Your skin becomes red, gets a rash, or has open sores.   Your pain does not  get better or it gets worse.  Get help right away if:   You lose feeling in your fingers or hand.   Your fingers turn white, very red, or cold and blue.   You cannot move your fingers.   You have a fever or chills.  This information is not intended to replace advice given to you by your health care provider. Make sure you discuss any questions you have with your health care provider.  Document Released: 08/27/2005 Document Revised: 06/12/2016 Document Reviewed: 06/05/2016  Elsevier Interactive Patient Education  2018 [employer]

## 2018-09-13 NOTE — Addendum Note (Signed)
Addended by: Wyvonne Lenz on: 09/13/2018 05:02 PM   Modules accepted: Orders

## 2018-09-13 NOTE — Telephone Encounter (Signed)
Called patient he said that he will pick up either on 09-13-18 or 09-14-18.

## 2018-09-14 ENCOUNTER — Ambulatory Visit (INDEPENDENT_AMBULATORY_CARE_PROVIDER_SITE_OTHER): Payer: BLUE CROSS/BLUE SHIELD

## 2018-09-14 DIAGNOSIS — M25532 Pain in left wrist: Secondary | ICD-10-CM

## 2018-09-14 DIAGNOSIS — M79645 Pain in left finger(s): Secondary | ICD-10-CM

## 2018-09-14 DIAGNOSIS — G8929 Other chronic pain: Secondary | ICD-10-CM | POA: Diagnosis not present

## 2018-09-16 ENCOUNTER — Encounter: Payer: Self-pay | Admitting: Family Medicine

## 2018-09-21 ENCOUNTER — Ambulatory Visit: Payer: Self-pay | Admitting: Family Medicine

## 2018-10-06 ENCOUNTER — Encounter: Payer: Self-pay | Admitting: Family Medicine

## 2018-10-08 ENCOUNTER — Encounter: Payer: Self-pay | Admitting: Internal Medicine

## 2018-10-08 ENCOUNTER — Ambulatory Visit (INDEPENDENT_AMBULATORY_CARE_PROVIDER_SITE_OTHER): Payer: BLUE CROSS/BLUE SHIELD | Admitting: Internal Medicine

## 2018-10-08 VITALS — BP 124/84 | HR 96 | Temp 98.6°F | Wt 181.4 lb

## 2018-10-08 DIAGNOSIS — R05 Cough: Secondary | ICD-10-CM | POA: Diagnosis not present

## 2018-10-08 DIAGNOSIS — J069 Acute upper respiratory infection, unspecified: Secondary | ICD-10-CM

## 2018-10-08 DIAGNOSIS — J329 Chronic sinusitis, unspecified: Secondary | ICD-10-CM | POA: Diagnosis not present

## 2018-10-08 DIAGNOSIS — R053 Chronic cough: Secondary | ICD-10-CM

## 2018-10-08 MED ORDER — AMOXICILLIN-POT CLAVULANATE 875-125 MG PO TABS: 1.0000 | ORAL_TABLET | Freq: Two times a day (BID) | ORAL | 0 refills | Status: DC

## 2018-10-08 NOTE — Patient Instructions (Signed)
Your exam is reassuring  But suspect a low grade sinusitis   Lung exam is clear   Antibiotic and  Flonase.   EVERY DAY    To help  Congestion   Add saline nose spray to help clear  Mucous .  If not improving in another 1-2 weeks make appt to see Dr Volanda Napoleon for follow up.

## 2018-10-08 NOTE — Progress Notes (Signed)
Chief Complaint  Patient presents with  . Cough    x 6 weeks    HPI: James Sloan 37 y.o.  pcp no appts today no improvement over a good amount of time  Despite  Sym rx and  Waiting .     productive cough  .loose   Tired of it.  Nothing helping  And ongoing  Onset  With sinus like drainage   Poss like a cold and took cold med  Tried mucinex   Cold and lfu and allergy medicine . Hs tried  Many    Has more  Nasal discharge .  No pain.   But full and more congested on left nose than right .  Can feel post nasal drainage also  Worse  Hx and am . And day not as bad.   no hx of asthma but has a left over   Inhaler .  Left over .   Staying  Outside .  2 small children with cough  .   But better  One had antibiotic   Ages 2 and 4  No hx of same    No sob fever wheezing  Takes med ofr   Hb doing .  ROS: See pertinent positives and negatives per HPI. No fever chills cp hemoptysis   Past Medical History:  Diagnosis Date  . Anxiety   . Depression   . GERD (gastroesophageal reflux disease)   . Hay fever   . Hypertension   . Lumbar herniated disc     Family History  Problem Relation Age of Onset  . Hyperlipidemia Father   . Cancer Maternal Grandmother   . ADD / ADHD Maternal Grandmother   . Cancer Maternal Grandfather   . ADD / ADHD Maternal Grandfather   . Hyperlipidemia Paternal Grandmother   . Stroke Paternal Grandfather   . Hyperlipidemia Paternal Grandfather     Social History   Socioeconomic History  . Marital status: Married    Spouse name: wittiney  . Number of children: 2  . Years of education: Not on file  . Highest education level: Bachelor's degree (e.g., BA, AB, BS)  Occupational History  . Not on file  Social Needs  . Financial resource strain: Not hard at all  . Food insecurity:    Worry: Never true    Inability: Never true  . Transportation needs:    Medical: No    Non-medical: No  Tobacco Use  . Smoking status: Never Smoker  . Smokeless  tobacco: Never Used  Substance and Sexual Activity  . Alcohol use: Yes    Alcohol/week: 2.0 - 4.0 standard drinks    Types: 1 - 2 Glasses of wine, 1 - 2 Cans of beer per week    Comment: a few drinks per week  . Drug use: Not Currently    Types: Marijuana  . Sexual activity: Yes  Lifestyle  . Physical activity:    Days per week: 3 days    Minutes per session: 60 min  . Stress: Not at all  Relationships  . Social connections:    Talks on phone: Once a week    Gets together: Never    Attends religious service: More than 4 times per year    Active member of club or organization: Yes    Attends meetings of clubs or organizations: More than 4 times per year    Relationship status: Married  Other Topics Concern  . Not on file  Social History Narrative  . Not on file    Outpatient Medications Prior to Visit  Medication Sig Dispense Refill  . buPROPion (WELLBUTRIN XL) 300 MG 24 hr tablet TAKE 1 TABLET BY MOUTH EVERY DAY 90 tablet 0  . CANNABIDIOL PO Take 15 mg by mouth 1 day or 1 dose.    . hydrOXYzine (ATARAX/VISTARIL) 25 MG tablet Take 25 mg by mouth as needed.    Marland Kitchen losartan (COZAAR) 100 MG tablet Take 1 tablet (100 mg total) by mouth daily. 30 tablet 1  . pantoprazole (PROTONIX) 40 MG tablet Take 1 tablet (40 mg total) by mouth daily. 30 tablet 1  . sertraline (ZOLOFT) 50 MG tablet TAKE 1 TABLET BY MOUTH EVERY DAY 90 tablet 0  . traZODone (DESYREL) 50 MG tablet Take 1 tablet (50 mg total) by mouth at bedtime. 30 tablet 1  . VYVANSE 50 MG capsule TAKE 1 CAPSULE BY MOUTH EVERY DAY IN THE MORNING 30 capsule 0   No facility-administered medications prior to visit.      EXAM:  BP 124/84 (BP Location: Left Arm, Patient Position: Sitting, Cuff Size: Normal)   Pulse 96   Temp 98.6 F (37 C) (Oral)   Wt 181 lb 6.4 oz (82.3 kg)   SpO2 97%   BMI 25.30 kg/m   Body mass index is 25.3 kg/m. .WDWN in NAD  quiet respirations; mildly congested  somewhat hoarse.  ocass loose cough   non toxic . HEENT: Normocephalic ;atraumatic , Eyes;  PERRL, EOMs  Full, lids and conjunctiva clear,,Ears: no deformities, canals nl, TM landmarks normal, Nose: no deformity or discharge but congested;face minimally tender  Left Mouth : OP clear without lesion or edema . Pos derange tracks  Cobblestoning  Neck: Supple without adenopathy or masses or bruits Chest:  Clear to A&P without wheezes rales or rhonchi CV:  S1-S2 no gallops or murmurs peripheral perfusion is normal Skin :nl perfusion and no acute rashes  PSYCH: pleasant and cooperative, no obvious depression or anxiety  ASSESSMENT AND PLAN:  Discussed the following assessment and plan:  Cough, persistent  Protracted URI  Sinusitis, unspecified chronicity, unspecified location Prolonged  Cough    No underlying lung disease per se . psoss secondary  Sinusitis sub acute with left nasal congestion more than right  emprici rx for  This and if  persistent or progressive then   Consider c xray etc . No alarming findings today  Does not seem like   elr   -Patient advised to return or notify health care team  if symptoms worsen ,persist or new concerns arise.  Patient Instructions  Your exam is reassuring  But suspect a low grade sinusitis   Lung exam is clear   Antibiotic and  Flonase.   EVERY DAY    To help  Congestion   Add saline nose spray to help clear  Mucous .  If not improving in another 1-2 weeks make appt to see Dr Volanda Napoleon for follow up.      Standley Brooking. Panosh M.D.

## 2018-10-14 DIAGNOSIS — M654 Radial styloid tenosynovitis [de Quervain]: Secondary | ICD-10-CM | POA: Insufficient documentation

## 2018-10-14 DIAGNOSIS — M25532 Pain in left wrist: Secondary | ICD-10-CM | POA: Insufficient documentation

## 2018-10-16 ENCOUNTER — Other Ambulatory Visit: Payer: Self-pay

## 2018-10-16 ENCOUNTER — Encounter (INDEPENDENT_AMBULATORY_CARE_PROVIDER_SITE_OTHER): Payer: BLUE CROSS/BLUE SHIELD | Admitting: Psychiatry

## 2018-10-16 ENCOUNTER — Encounter (HOSPITAL_COMMUNITY): Payer: Self-pay | Admitting: Psychiatry

## 2018-10-16 VITALS — BP 124/82 | Wt 179.0 lb

## 2018-10-16 DIAGNOSIS — F411 Generalized anxiety disorder: Secondary | ICD-10-CM | POA: Diagnosis not present

## 2018-10-16 DIAGNOSIS — F5102 Adjustment insomnia: Secondary | ICD-10-CM

## 2018-10-16 DIAGNOSIS — F900 Attention-deficit hyperactivity disorder, predominantly inattentive type: Secondary | ICD-10-CM

## 2018-10-16 DIAGNOSIS — F331 Major depressive disorder, recurrent, moderate: Secondary | ICD-10-CM

## 2018-10-16 MED ORDER — TRAZODONE HCL 50 MG PO TABS: 50.0000 mg | ORAL_TABLET | Freq: Every day | ORAL | 1 refills | Status: DC

## 2018-10-16 MED ORDER — SERTRALINE HCL 50 MG PO TABS: 50.0000 mg | ORAL_TABLET | Freq: Every day | ORAL | 0 refills | Status: DC

## 2018-10-16 MED ORDER — BUPROPION HCL ER (XL) 300 MG PO TB24: 300.0000 mg | ORAL_TABLET | Freq: Every day | ORAL | 0 refills | Status: DC

## 2018-10-16 NOTE — Progress Notes (Signed)
[facility] Outpatient Follow up visit   Patient Identification: James Sloan MRN:  [id number] Date of Evaluation:  10/16/2018 Referral Source: self referral Chief Complaint:   Chief Complaint    Error; Medication Refill     Visit Diagnosis:    ICD-10-CM   1. Major depressive disorder, recurrent episode, moderate (HCC) F33.1   2. GAD (generalized anxiety disorder) F41.1   3. Adjustment insomnia F51.02   4. Attention deficit hyperactivity disorder (ADHD), predominantly inattentive type F90.0     History of Present Illness:  37 years old white married male. Relocated from [place] , developing care. Had psychiatrist in [place] and diagnosed with depression, anxiety, adhd and insomnia  Doing fair by adding zoloft. Less depressed. Also on wellbutrin Seldom takes vyvanse for adhd since no job yet Wants to fu with [facility]   Still takes trazadone says tried to lower but had problems sleeping    Duration more than 10 years  Modifying factor: wife  Activity factors as of now no job. Relocation.  Severity of depression is fair and balanced   No side effects reported   Substance Abuse History in the last 12 months:  No.  Consequences of Substance Abuse: NA  Past Medical History:  Past Medical History:  Diagnosis Date  . Anxiety   . Depression   . GERD (gastroesophageal reflux disease)   . Hay fever   . Hypertension   . Lumbar herniated disc     Past Surgical History:  Procedure Laterality Date  . APPENDECTOMY    . KNEE SURGERY Left   . SHOULDER ARTHROCENTESIS Left     Family Psychiatric History: denies   Family History:  Family History  Problem Relation Age of Onset  . Hyperlipidemia Father   . Cancer Maternal Grandmother   . ADD / ADHD Maternal Grandmother   . Cancer Maternal Grandfather   . ADD / ADHD Maternal Grandfather   . Hyperlipidemia Paternal Grandmother   . Stroke Paternal Grandfather   . Hyperlipidemia Paternal Grandfather     Social  History:   Social History   Socioeconomic History  . Marital status: Married    Spouse name: wittiney  . Number of children: 2  . Years of education: Not on file  . Highest education level: Bachelor's degree (e.g., BA, AB, BS)  Occupational History  . Not on file  Social Needs  . Financial resource strain: Not hard at all  . Food insecurity:    Worry: Never true    Inability: Never true  . Transportation needs:    Medical: No    Non-medical: No  Tobacco Use  . Smoking status: Never Smoker  . Smokeless tobacco: Never Used  Substance and Sexual Activity  . Alcohol use: Yes    Alcohol/week: 2.0 - 4.0 standard drinks    Types: 1 - 2 Glasses of wine, 1 - 2 Cans of beer per week    Comment: a few drinks per week  . Drug use: Not Currently    Types: Marijuana  . Sexual activity: Yes  Lifestyle  . Physical activity:    Days per week: 3 days    Minutes per session: 60 min  . Stress: Not at all  Relationships  . Social connections:    Talks on phone: Once a week    Gets together: Never    Attends religious service: More than 4 times per year    Active member of club or organization: Yes    Attends meetings  of clubs or organizations: More than 4 times per year    Relationship status: Married  Other Topics Concern  . Not on file  Social History Narrative  . Not on file    Additional Social History: grew up with parents. Fine . No trauma. Married for 9 years   Allergies:   Allergies  Allergen Reactions  . Iodinated Diagnostic Agents Hives  . Iodine Hives    Metabolic Disorder Labs: Lab Results  Component Value Date   HGBA1C 5.1 08/09/2018   No results found for: PROLACTIN Lab Results  Component Value Date   CHOL 196 08/09/2018   TRIG 197.0 (H) 08/09/2018   HDL 42.80 08/09/2018   CHOLHDL 5 08/09/2018   VLDL 39.4 08/09/2018   LDLCALC 114 (H) 08/09/2018     Current Medications: Current Outpatient Medications  Medication Sig Dispense Refill  . buPROPion  (WELLBUTRIN XL) 300 MG 24 hr tablet Take 1 tablet (300 mg total) by mouth daily. 90 tablet 0  . CANNABIDIOL PO Take 15 mg by mouth 1 day or 1 dose.    . hydrOXYzine (ATARAX/VISTARIL) 25 MG tablet Take 25 mg by mouth as needed.    Marland Kitchen losartan (COZAAR) 100 MG tablet Take 1 tablet (100 mg total) by mouth daily. 30 tablet 1  . pantoprazole (PROTONIX) 40 MG tablet Take 1 tablet (40 mg total) by mouth daily. 30 tablet 1  . sertraline (ZOLOFT) 50 MG tablet Take 1 tablet (50 mg total) by mouth daily. 90 tablet 0  . traZODone (DESYREL) 50 MG tablet Take 1 tablet (50 mg total) by mouth at bedtime. 30 tablet 1  . VYVANSE 50 MG capsule TAKE 1 CAPSULE BY MOUTH EVERY DAY IN THE MORNING 30 capsule 0   No current facility-administered medications for this visit.       Psychiatric Specialty Exam: Review of Systems  Cardiovascular: Negative for chest pain and palpitations.  Neurological: Negative for tremors.  Psychiatric/Behavioral: Negative for suicidal ideas.    Blood pressure 124/82, weight 179 lb (81.2 kg).Body mass index is 24.97 kg/m.  General Appearance: Casual  Eye Contact:  Fair  Speech:  Slow  Volume:  Normal  Mood: fair  Affect:  Congruent  Thought Process:  Goal Directed  Orientation:  Full (Time, Place, and Person)  Thought Content:  Rumination  Suicidal Thoughts:  No  Homicidal Thoughts:  No  Memory:  Immediate;   Fair Recent;   Fair  Judgement:  Good  Insight:  Good  Psychomotor Activity:  Normal  Concentration:  Concentration: Fair and Attention Span: Fair  Recall:  [employer] of Knowledge:Fair  Language: Fair  Akathisia:  Negative  Handed:  Right  AIMS (if indicated):    Assets:  Desire for Improvement  ADL's:  Intact  Cognition: WNL  Sleep:  Fair on meds    Treatment Plan Summary: Medication management and Plan as follows  1. Major depressive disorder, mild in remission: doing fair. Continue zoloft and wellbutrin 2. FBP:ZWCHENID by zoloft, continue 3. Insomnia:  ongoing without trazadone. Will continue   4. ADHD; diagnosed by psychological testing in college. Seldom takes vyvanse.says need refill as he searches for job Will renew More than 50% time spent in counseling and coordination of care including patient education and review side effects and concerns were addressed  Fu 34m.   Merian Capron, MD 11/16/20198:41 AM

## 2018-10-18 IMAGING — DX DG WRIST COMPLETE 3+V*L*
4 series · 4 of 4 positions shown · non-contrast
Comparison: None.

CLINICAL DATA: Constant dull pain at base of left thumb. Fall 1
month ago.

EXAM:
LEFT WRIST - COMPLETE 3+ VIEW

[wrist pa (1 of 2)]
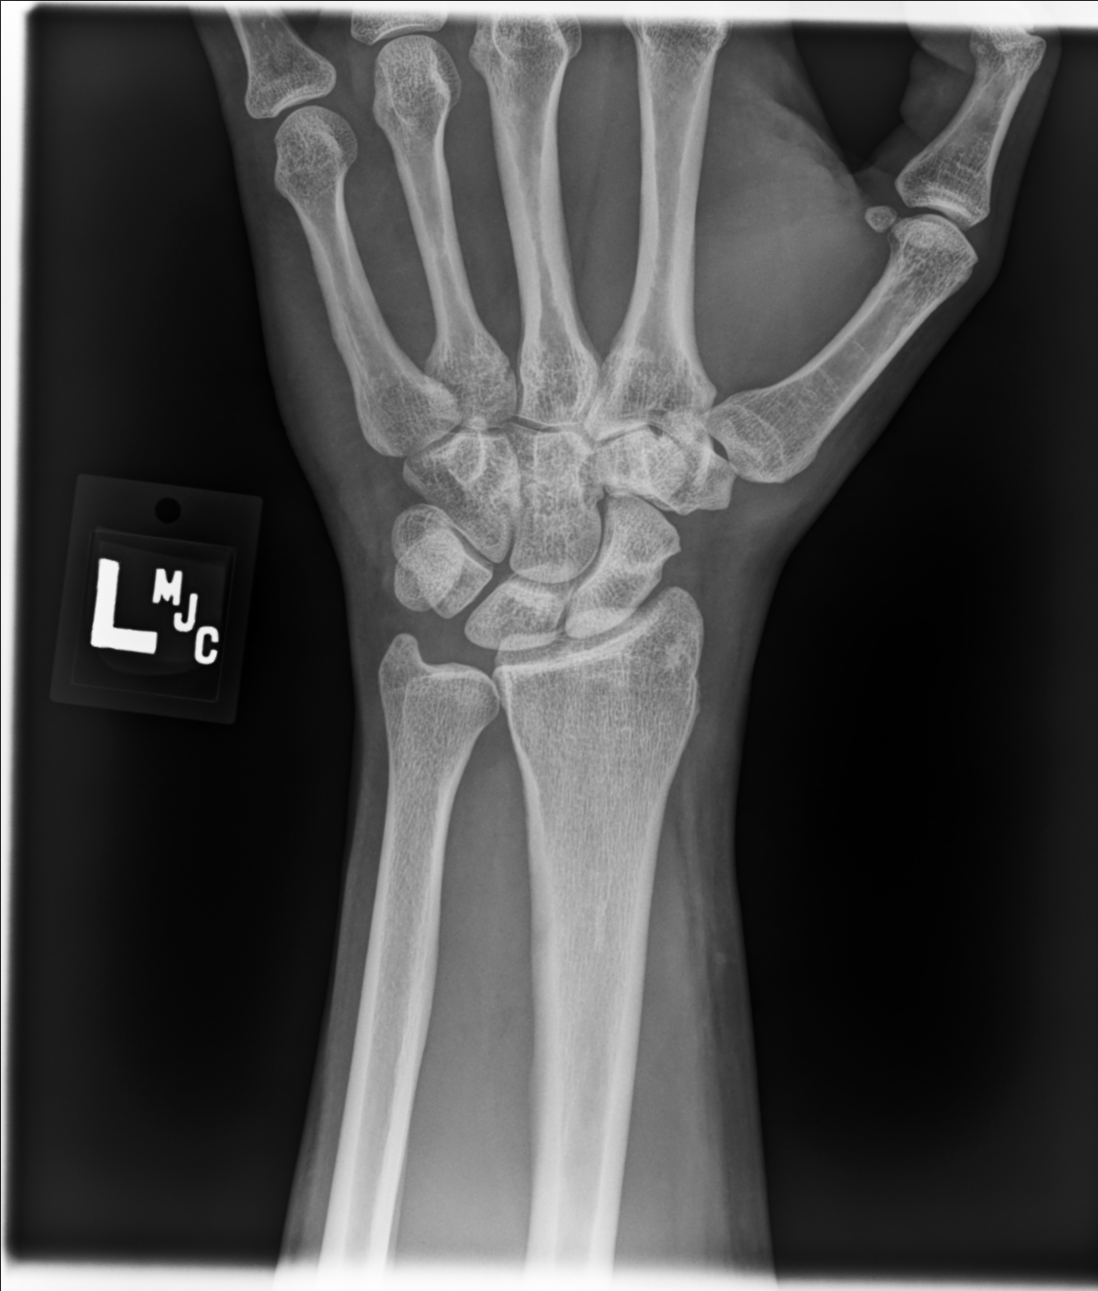

[wrist mlo]
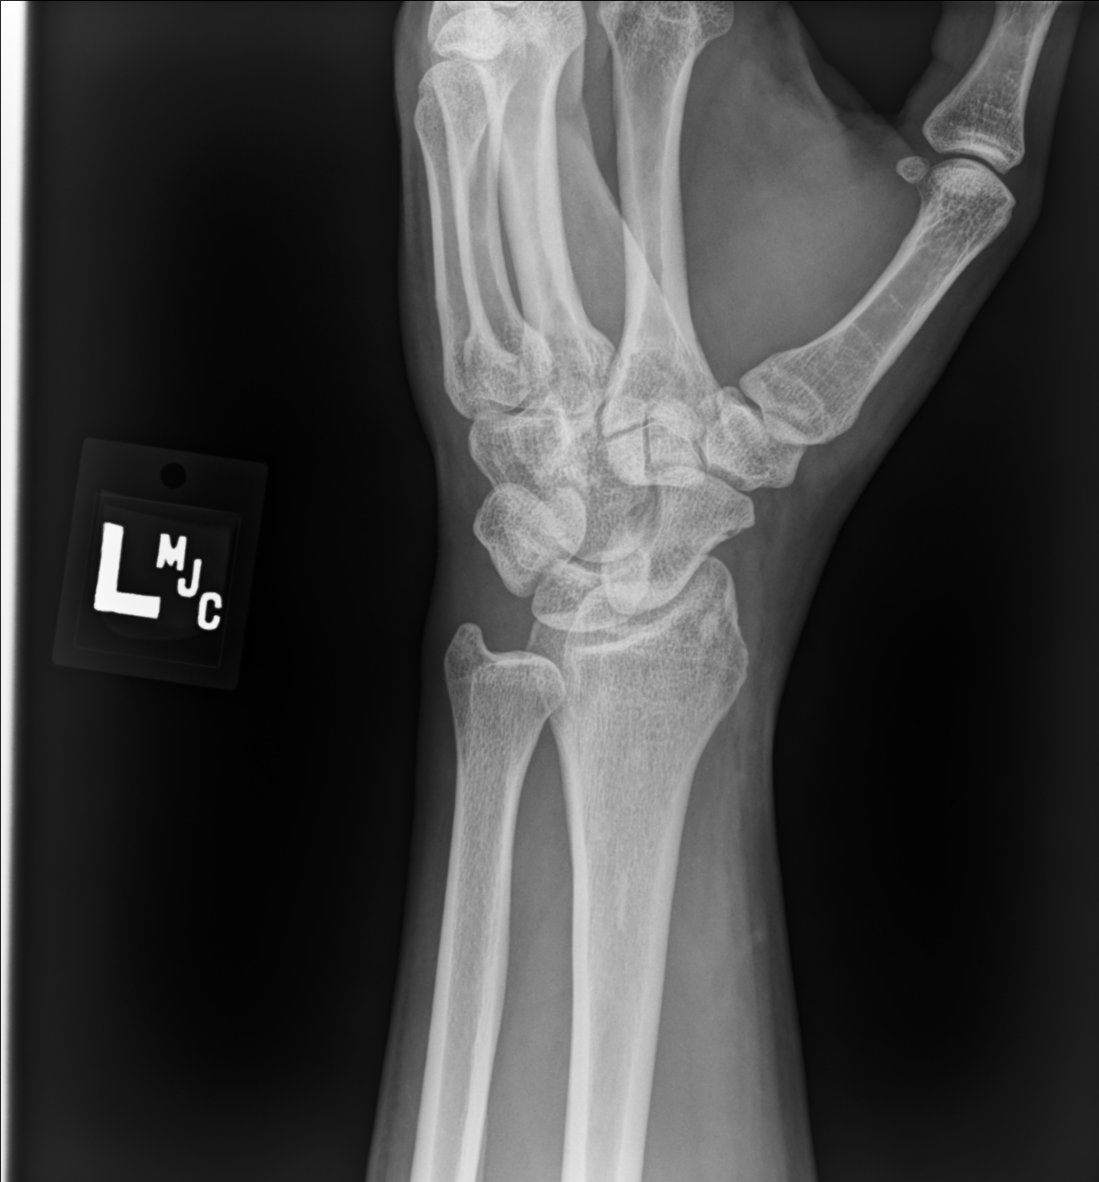

[wrist lat]
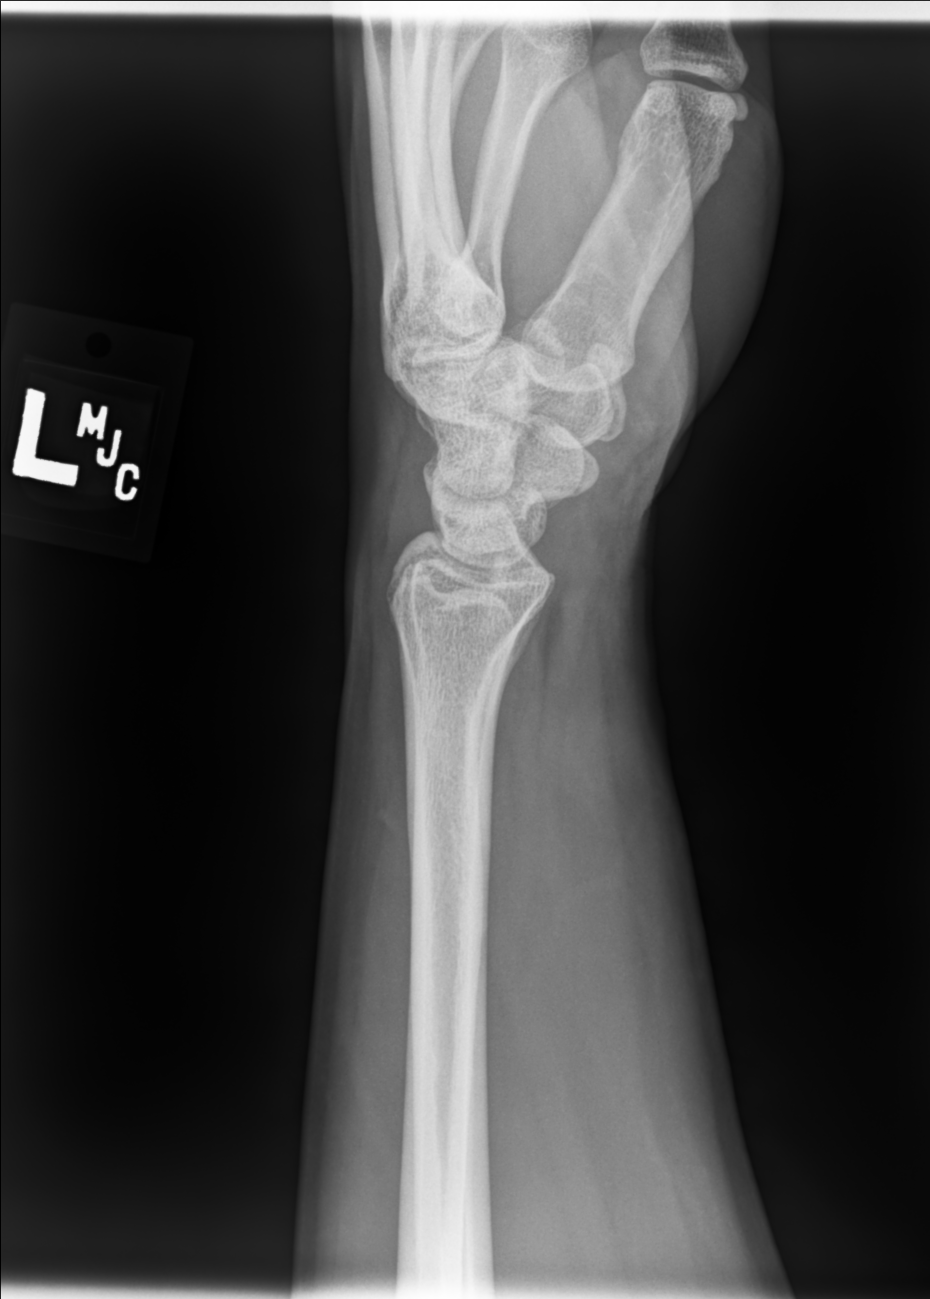

[wrist pa (2 of 2)]
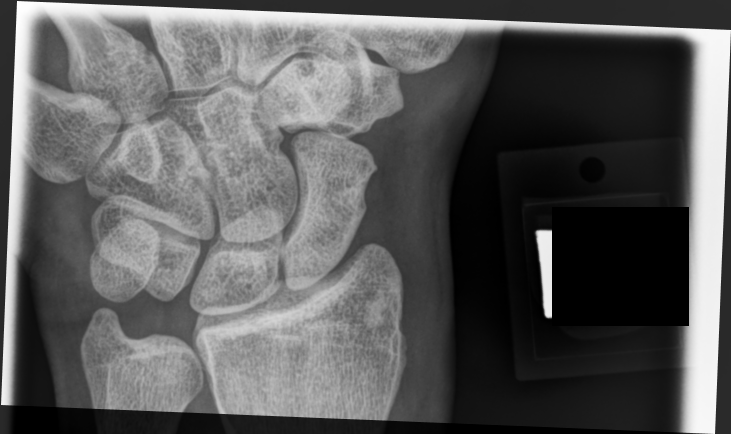

[4 of 4 positions shown; findings below may reference images not displayed]

FINDINGS: There is no evidence of fracture or dislocation. There is no
evidence of arthropathy or other focal bone abnormality. Soft
tissues are unremarkable.
IMPRESSION: Negative.

## 2018-11-01 ENCOUNTER — Telehealth (HOSPITAL_COMMUNITY): Payer: Self-pay | Admitting: Psychiatry

## 2018-11-01 MED ORDER — VYVANSE 50 MG PO CAPS: ORAL_CAPSULE | ORAL | 0 refills | Status: DC

## 2018-11-01 NOTE — Telephone Encounter (Signed)
Left vm informing patient that medication was sent to the pharmacy

## 2018-11-01 NOTE — Telephone Encounter (Signed)
vyvanse sent for refill. Please check which pharmacy sent. I sent one on file

## 2018-11-01 NOTE — Telephone Encounter (Signed)
Pt needs refill on [facility]vanse [facility] [address] [facility] [place]   CB # [phone]

## 2018-12-08 ENCOUNTER — Other Ambulatory Visit: Payer: Self-pay | Admitting: Family Medicine

## 2018-12-08 ENCOUNTER — Telehealth (HOSPITAL_COMMUNITY): Payer: Self-pay

## 2018-12-08 MED ORDER — PANTOPRAZOLE SODIUM 40 MG PO TBEC: 40.0000 mg | DELAYED_RELEASE_TABLET | Freq: Every day | ORAL | 0 refills | Status: DC

## 2018-12-08 MED ORDER — VYVANSE 50 MG PO CAPS: ORAL_CAPSULE | ORAL | 0 refills | Status: DC

## 2018-12-08 MED ORDER — LOSARTAN POTASSIUM 100 MG PO TABS: 100.0000 mg | ORAL_TABLET | Freq: Every day | ORAL | 0 refills | Status: DC

## 2018-12-08 NOTE — Telephone Encounter (Signed)
I sent again just now

## 2018-12-08 NOTE — Telephone Encounter (Signed)
sent 

## 2018-12-08 NOTE — Telephone Encounter (Signed)
Copied from [place] [id number]. Topic: Quick Communication - Rx Refill/Question >> Dec 08, 2018  9:25 AM Alanda Slim E wrote: Medication: losartan (COZAAR) 100 MG tablet   and  pantoprazole (PROTONIX) 40 MG tablet  Has the patient contacted their pharmacy? Yes (pharmacy keeps sending request into the Pt previous provider)    Preferred Pharmacy (with phone number or street name): [facility] [place] [place] [address] [phone] (Phone) [phone] (Fax)    Agent: Please be advised that RX refills may take up to 3 business days. We ask that you follow-up with your pharmacy.

## 2018-12-08 NOTE — Telephone Encounter (Signed)
Patient needs refill on [facility]vanse. [facility] [address] [facility] [address]

## 2018-12-08 NOTE — Telephone Encounter (Signed)
Called patient and scheduled 6 month appointment

## 2018-12-08 NOTE — Telephone Encounter (Signed)
[facility] [address] [facility] [address] called and stated they need Dr. De Nurse to resend Vyvanse rx because it did not come thru their system correctly.

## 2018-12-10 ENCOUNTER — Ambulatory Visit: Payer: BLUE CROSS/BLUE SHIELD | Admitting: Family Medicine

## 2018-12-10 ENCOUNTER — Encounter: Payer: Self-pay | Admitting: Family Medicine

## 2018-12-10 VITALS — BP 128/82 | HR 98 | Temp 98.5°F | Wt 186.0 lb

## 2018-12-10 DIAGNOSIS — K219 Gastro-esophageal reflux disease without esophagitis: Secondary | ICD-10-CM | POA: Diagnosis not present

## 2018-12-10 NOTE — Patient Instructions (Signed)
Food Choices for Gastroesophageal Reflux Disease, Adult  When you have gastroesophageal reflux disease (GERD), the foods you eat and your eating habits are very important. Choosing the right foods can help ease your discomfort. Think about working with a nutrition specialist (dietitian) to help you make good choices.  What are tips for following this plan?    Meals   Choose healthy foods that are low in fat, such as fruits, vegetables, whole grains, low-fat dairy products, and lean meat, fish, and poultry.   Eat small meals often instead of 3 large meals a day. Eat your meals slowly, and in a place where you are relaxed. Avoid bending over or lying down until 2-3 hours after eating.   Avoid eating meals 2-3 hours before bed.   Avoid drinking a lot of liquid with meals.   Cook foods using methods other than frying. Bake, grill, or broil food instead.   Avoid or limit:  ? Chocolate.  ? Peppermint or spearmint.  ? Alcohol.  ? Pepper.  ? Black and decaffeinated coffee.  ? Black and decaffeinated tea.  ? Bubbly (carbonated) soft drinks.  ? Caffeinated energy drinks and soft drinks.   Limit high-fat foods such as:  ? Fatty meat or fried foods.  ? Whole milk, cream, butter, or ice cream.  ? Nuts and nut butters.  ? Pastries, donuts, and sweets made with butter or shortening.   Avoid foods that cause symptoms. These foods may be different for everyone. Common foods that cause symptoms include:  ? Tomatoes.  ? Oranges, lemons, and limes.  ? Peppers.  ? Spicy food.  ? Onions and garlic.  ? Vinegar.  Lifestyle   Maintain a healthy weight. Ask your doctor what weight is healthy for you. If you need to lose weight, work with your doctor to do so safely.   Exercise for at least 30 minutes for 5 or more days each week, or as told by your doctor.   Wear loose-fitting clothes.   Do not smoke. If you need help quitting, ask your doctor.   Sleep with the head of your bed higher than your feet. Use a wedge under the  mattress or blocks under the bed frame to raise the head of the bed.  Summary   When you have gastroesophageal reflux disease (GERD), food and lifestyle choices are very important in easing your symptoms.   Eat small meals often instead of 3 large meals a day. Eat your meals slowly, and in a place where you are relaxed.   Limit high-fat foods such as fatty meat or fried foods.   Avoid bending over or lying down until 2-3 hours after eating.   Avoid peppermint and spearmint, caffeine, alcohol, and chocolate.  This information is not intended to replace advice given to you by your health care provider. Make sure you discuss any questions you have with your health care provider.  Document Released: 05/18/2012 Document Revised: 12/23/2016 Document Reviewed: 12/23/2016  Elsevier Interactive Patient Education  2019 Elsevier Inc[employer]    Gastroesophageal Reflux Disease, Adult  Gastroesophageal reflux (GER) happens when acid from the stomach flows up into the tube that connects the mouth and the stomach (esophagus). Normally, food travels down the esophagus and stays in the stomach to be digested. However, when a person has GER, food and stomach acid sometimes move back up into the esophagus. If this becomes a more serious problem, the person may be diagnosed with a disease called gastroesophageal reflux disease (GERD).   GERD occurs when the reflux:   Happens often.   Causes frequent or severe symptoms.   Causes problems such as damage to the esophagus.  When stomach acid comes in contact with the esophagus, the acid may cause soreness (inflammation) in the esophagus. Over time, GERD may create small holes (ulcers) in the lining of the esophagus.  What are the causes?  This condition is caused by a problem with the muscle between the esophagus and the stomach (lower esophageal sphincter, or LES). Normally, the LES muscle closes after food passes through the esophagus to the stomach. When the LES is weakened or abnormal,  it does not close properly, and that allows food and stomach acid to go back up into the esophagus.  The LES can be weakened by certain dietary substances, medicines, and medical conditions, including:   Tobacco use.   Pregnancy.   Having a hiatal hernia.   Alcohol use.   Certain foods and beverages, such as coffee, chocolate, onions, and peppermint.  What increases the risk?  You are more likely to develop this condition if you:   Have an increased body weight.   Have a connective tissue disorder.   Use NSAID medicines.  What are the signs or symptoms?  Symptoms of this condition include:   Heartburn.   Difficult or painful swallowing.   The feeling of having a lump in the throat.   Abitter taste in the mouth.   Bad breath.   Having a large amount of saliva.   Having an upset or bloated stomach.   Belching.   Chest pain. Different conditions can cause chest pain. Make sure you see your health care provider if you experience chest pain.   Shortness of breath or wheezing.   Ongoing (chronic) cough or a night-time cough.   Wearing away of tooth enamel.   Weight loss.  How is this diagnosed?  Your health care provider will take a medical history and perform a physical exam. To determine if you have mild or severe GERD, your health care provider may also monitor how you respond to treatment. You may also have tests, including:   A test to examine your stomach and esophagus with a small camera (endoscopy).   A test thatmeasures the acidity level in your esophagus.   A test thatmeasures how much pressure is on your esophagus.   A barium swallow or modified barium swallow test to show the shape, size, and functioning of your esophagus.  How is this treated?  The goal of treatment is to help relieve your symptoms and to prevent complications. Treatment for this condition may vary depending on how severe your symptoms are. Your health care provider may recommend:   Changes to your  diet.   Medicine.   Surgery.  Follow these instructions at home:  Eating and drinking     Follow a diet as recommended by your health care provider. This may involve avoiding foods and drinks such as:  ? Coffee and tea (with or without caffeine).  ? Drinks that containalcohol.  ? Energy drinks and sports drinks.  ? Carbonated drinks or sodas.  ? Chocolate and cocoa.  ? Peppermint and mint flavorings.  ? Garlic and onions.  ? Horseradish.  ? Spicy and acidic foods, including peppers, chili powder, curry powder, vinegar, hot sauces, and barbecue sauce.  ? Citrus fruit juices and citrus fruits, such as oranges, lemons, and limes.  ? Tomato-based foods, such as red sauce, chili, salsa, and pizza   with red sauce.  ? Fried and fatty foods, such as donuts, french fries, potato chips, and high-fat dressings.  ? High-fat meats, such as hot dogs and fatty cuts of red and white meats, such as rib eye steak, sausage, ham, and bacon.  ? High-fat dairy items, such as whole milk, butter, and cream cheese.   Eat small, frequent meals instead of large meals.   Avoid drinking large amounts of liquid with your meals.   Avoid eating meals during the 2-3 hours before bedtime.   Avoid lying down right after you eat.   Do not exercise right after you eat.  Lifestyle     Do not use any products that contain nicotine or tobacco, such as cigarettes, e-cigarettes, and chewing tobacco. If you need help quitting, ask your health care provider.   Try to reduce your stress by using methods such as yoga or meditation. If you need help reducing stress, ask your health care provider.   If you are overweight, reduce your weight to an amount that is healthy for you. Ask your health care provider for guidance about a safe weight loss goal.  General instructions   Pay attention to any changes in your symptoms.   Take over-the-counter and prescription medicines only as told by your health care provider. Do not take aspirin, ibuprofen, or  other NSAIDs unless your health care provider told you to do so.   Wear loose-fitting clothing. Do not wear anything tight around your waist that causes pressure on your abdomen.   Raise (elevate) the head of your bed about 6 inches (15 cm).   Avoid bending over if this makes your symptoms worse.   Keep all follow-up visits as told by your health care provider. This is important.  Contact a health care provider if:   You have:  ? New symptoms.  ? Unexplained weight loss.  ? Difficulty swallowing or it hurts to swallow.  ? Wheezing or a persistent cough.  ? A hoarse voice.   Your symptoms do not improve with treatment.  Get help right away if you:   Have pain in your arms, neck, jaw, teeth, or back.   Feel sweaty, dizzy, or light-headed.   Have chest pain or shortness of breath.   Vomit and your vomit looks like blood or coffee grounds.   Faint.   Have stool that is bloody or black.   Cannot swallow, drink, or eat.  Summary   Gastroesophageal reflux happens when acid from the stomach flows up into the esophagus. GERD is a disease in which the reflux happens often, causes frequent or severe symptoms, or causes problems such as damage to the esophagus.   Treatment for this condition may vary depending on how severe your symptoms are. Your health care provider may recommend diet and lifestyle changes, medicine, or surgery.   Contact a health care provider if you have new or worsening symptoms.   Take over-the-counter and prescription medicines only as told by your health care provider. Do not take aspirin, ibuprofen, or other NSAIDs unless your health care provider told you to do so.   Keep all follow-up visits as told by your health care provider. This is important.  This information is not intended to replace advice given to you by your health care provider. Make sure you discuss any questions you have with your health care provider.  Document Released: 08/27/2005 Document Revised: 05/26/2018  Document Reviewed: 05/26/2018  Elsevier Interactive Patient Education  2019 Elsevier   Inc.

## 2018-12-10 NOTE — Progress Notes (Signed)
Subjective:    Patient ID: James Sloan, male    DOB: Mar 21, 1981, 38 y.o.   MRN: [id number]  No chief complaint on file.   HPI Patient was seen today for ongoing concern.  Increased heartburn symptoms over the last few days.  Pt notes burning in lower chest and mid chest,  Coughing in the morning and at night.  Endorses trouble sleeping 2/2 burning in chest.  Started taking Prilosec and Tums in addition to Protonix 40 when symptoms increased.  Symptoms worse with drinking water.  At time symptoms started pt was eating normal foods, no spicy or acidic things.  Typically eats dinner at 6pm, goes to bed around 10:30-11 pm.  pt is not using NSAIDs or aspirin.  Pt denies acid taste in mouth, chest pain, SOB, N/V.  Past Medical History:  Diagnosis Date  . Anxiety   . Depression   . GERD (gastroesophageal reflux disease)   . Hay fever   . Hypertension   . Lumbar herniated disc     Allergies  Allergen Reactions  . Iodinated Diagnostic Agents Hives  . Iodine Hives    ROS General: Denies fever, chills, night sweats, changes in weight, changes in appetite HEENT: Denies headaches, ear pain, changes in vision, rhinorrhea, sore throat CV: Denies CP, palpitations, SOB, orthopnea Pulm: Denies SOB, cough, wheezing GI: Denies abdominal pain, nausea, vomiting, diarrhea, constipation  +heart burn. GU: Denies dysuria, hematuria, frequency, vaginal discharge Msk: Denies muscle cramps, joint pains Neuro: Denies weakness, numbness, tingling Skin: Denies rashes, bruising Psych: Denies depression, anxiety, hallucinations      Objective:    Blood pressure 128/82, pulse 98, temperature 98.5 F (36.9 C), temperature source Oral, weight 186 lb (84.4 kg), SpO2 98 %.  Gen. Pleasant, well-nourished, in no distress, normal affect HEENT: [place]/AT, face symmetric, no scleral icterus, PERRLA, nares patent without drainage, pharynx without erythema or exudate. Lungs: no accessory muscle use, CTAB, no  wheezes or rales Cardiovascular: RRR, no m/r/g, no peripheral edema Abdomen: BS present, soft, NT/ND Neuro:  A&Ox3, CN II-XII intact, normal gait  Wt Readings from Last 3 Encounters:  12/10/18 186 lb (84.4 kg)  10/08/18 181 lb 6.4 oz (82.3 kg)  09/13/18 183 lb (83 kg)    Lab Results  Component Value Date   WBC 4.2 08/09/2018   HGB 14.7 08/09/2018   HCT 42.1 08/09/2018   PLT 198.0 08/09/2018   GLUCOSE 90 08/09/2018   CHOL 196 08/09/2018   TRIG 197.0 (H) 08/09/2018   HDL 42.80 08/09/2018   LDLCALC 114 (H) 08/09/2018   NA 139 08/09/2018   K 4.0 08/09/2018   CL 101 08/09/2018   CREATININE 0.97 08/09/2018   BUN 14 08/09/2018   CO2 32 08/09/2018   HGBA1C 5.1 08/09/2018    Assessment/Plan:  Gastroesophageal reflux disease, esophagitis presence not specified  -Concerns for hiatal hernia, gastritis given increased symptoms -We will increase Protonix to 40 mg twice daily -Discussed avoiding foods known to cause symptoms -Given increased symptoms we will place referral to GI for possible EGD -Given handouts - Plan: Ambulatory referral to Gastroenterology  Follow-up PRN  Grier Mitts, MD

## 2018-12-14 ENCOUNTER — Encounter: Payer: Self-pay | Admitting: Gastroenterology

## 2018-12-22 ENCOUNTER — Encounter: Payer: Self-pay | Admitting: Gastroenterology

## 2018-12-22 ENCOUNTER — Ambulatory Visit: Payer: BLUE CROSS/BLUE SHIELD | Admitting: Gastroenterology

## 2018-12-22 VITALS — BP 140/86 | HR 94 | Ht 71.0 in | Wt 186.5 lb

## 2018-12-22 DIAGNOSIS — K219 Gastro-esophageal reflux disease without esophagitis: Secondary | ICD-10-CM | POA: Diagnosis not present

## 2018-12-22 MED ORDER — SUCRALFATE 1 GM/10ML PO SUSP: 1.0000 g | Freq: Four times a day (QID) | ORAL | 1 refills | Status: DC | PRN

## 2018-12-22 NOTE — Progress Notes (Signed)
HPI :  38 year old male with a history of GERD, anxiety, depression, referred here by Grier Mitts MD for further evaluation and treatment of GERD.  He reports experiencing reflux symptoms for the past 18 years or so. He has typical symptoms of pyrosis in the chest and epigastric area, with occasional regurgitation. He has also experienced some occasional nausea with intermittent cough. He denies any dysphagia. He denies any postprandial abdominal pain or vomiting. Symptoms tend to bother him mostly in the morning or after he eats. He has been on numerous medications for reflux over the years to include Prilosec, Protonix, Nexium, AcipHex, Prevacid, Zantac, and Pepcid. He reports all these have helped to some extent but then tend to wear off over time and symptoms recur. He thinks Protonix has worked the best for him of all these regimens and has been on this for about the past 4 years. Currently he is taking 40 mg twice a day, one dose in the morning, one dose before bed. He states despite this he continues to have reflux on a daily basis. He has never been on Dexilant. He denies any family history of esophageal cancer. He has not been on Carafate the past.  He is otherwise in good health without any complaints today  Past Medical History:  Diagnosis Date  . Anxiety   . Depression   . GERD (gastroesophageal reflux disease)   . Hay fever   . Hypertension   . Lumbar herniated disc      Past Surgical History:  Procedure Laterality Date  . APPENDECTOMY    . KNEE SURGERY Left   . SHOULDER ARTHROCENTESIS Left    Family History  Problem Relation Age of Onset  . Hyperlipidemia Father   . Cancer Maternal Grandmother   . ADD / ADHD Maternal Grandmother   . Cancer Maternal Grandfather   . ADD / ADHD Maternal Grandfather   . Hyperlipidemia Paternal Grandmother   . Stroke Paternal Grandfather   . Hyperlipidemia Paternal Grandfather   . Colon polyps Paternal Grandfather        Numerous    Social History   Tobacco Use  . Smoking status: Never Smoker  . Smokeless tobacco: Never Used  Substance Use Topics  . Alcohol use: Yes    Alcohol/week: 2.0 - 4.0 standard drinks    Types: 1 - 2 Glasses of wine, 1 - 2 Cans of beer per week    Comment: a few drinks per week  . Drug use: Not Currently    Types: Marijuana   Current Outpatient Medications  Medication Sig Dispense Refill  . buPROPion (WELLBUTRIN XL) 300 MG 24 hr tablet Take 1 tablet (300 mg total) by mouth daily. 90 tablet 0  . CANNABIDIOL PO Take 15 mg by mouth 1 day or 1 dose.    . hydrOXYzine (ATARAX/VISTARIL) 25 MG tablet Take 25 mg by mouth as needed.    Marland Kitchen losartan (COZAAR) 100 MG tablet Take 1 tablet (100 mg total) by mouth daily. 90 tablet 0  . pantoprazole (PROTONIX) 40 MG tablet Take 1 tablet (40 mg total) by mouth daily. 90 tablet 0  . sertraline (ZOLOFT) 50 MG tablet Take 1 tablet (50 mg total) by mouth daily. 90 tablet 0  . traZODone (DESYREL) 50 MG tablet Take 1 tablet (50 mg total) by mouth at bedtime. 30 tablet 1  . VYVANSE 50 MG capsule TAKE 1 CAPSULE BY MOUTH EVERY DAY IN THE MORNING 30 capsule 0   No current facility-administered  medications for this visit.    Allergies  Allergen Reactions  . Iodinated Diagnostic Agents Hives  . Iodine Hives     Review of Systems: All systems reviewed and negative except where noted in HPI.    No results found.  Physical Exam: BP 140/86   Pulse 94   Ht 5\' 11"  (1.803 m)   Wt 186 lb 8 oz (84.6 kg)   BMI 26.01 kg/m  Constitutional: Pleasant,well-developed, male in no acute distress. HEENT: Normocephalic and atraumatic. Conjunctivae are normal. No scleral icterus. Neck supple.  Cardiovascular: Normal rate, regular rhythm.  Pulmonary/chest: Effort normal and breath sounds normal. No wheezing, rales or rhonchi. Abdominal: Soft, nondistended, nontender.  There are no masses palpable. No hepatomegaly. Extremities: no edema Lymphadenopathy: No cervical  adenopathy noted. Neurological: Alert and oriented to person place and time. Skin: Skin is warm and dry. No rashes noted. Psychiatric: Normal mood and affect. Behavior is normal.   ASSESSMENT AND PLAN: 38 year old male seen in consultation for the following:  GERD - long-standing persistent reflux symptoms, now with frequent breakthrough despite Protonix 40 mg twice daily. I discussed long-term options with him. Given his persistent symptoms over time, with frequent breakthrough despite PPI, we may need to consider interventional therapy such as TIF or surgery. I discussed what each of these entailed. I think an upper endoscopy is a good idea at this point to reassess his anatomy, ensure no evidence of Barrett's esophagus or ongoing esophagitis, assess for the presence of hiatal hernia, and assess his candidacy for TIF. I discussed the risks and benefits of endoscopy and anesthesia with him and he wanted to proceed. In the interim recommend he take his Protonix 30-60 minutes before meals. He will continue the present dose of that. I tried to get a sample of Dexilant however that was not available today. He wishes to await his endoscopy prior to changing the Protonix. I will otherwise add liquid Carafate 10 mL every 6 hours as needed in the interim. If he does not have any significant hiatal hernia on this exam, I may have him see Dr. Bryan Lemma for consideration of TIF. He agreed.  Berlin Cellar, MD [facility]  CC: Billie Ruddy, MD

## 2018-12-22 NOTE — Patient Instructions (Addendum)
If you are age 39 or older, your body mass index should be between 23-30. Your Body mass index is 26.01 kg/m. If this is out of the aforementioned range listed, please consider follow up with your Primary Care Provider.  If you are age 67 or younger, your body mass index should be between 19-25. Your Body mass index is 26.01 kg/m. If this is out of the aformentioned range listed, please consider follow up with your Primary Care Provider.   You have been scheduled for an endoscopy. Please follow written instructions given to you at your visit today. If you use inhalers (even only as needed), please bring them with you on the day of your procedure. Your physician has requested that you go to www.startemmi.com and enter the access code given to you at your visit today. This web site gives a general overview about your procedure. However, you should still follow specific instructions given to you by our office regarding your preparation for the procedure.  We have sent the following medications to your pharmacy for you to pick up at your convenience: Carafate: Drink 98mls every 6 hours as needed   Thank you for entrusting me with your care and for choosing [facility], [address] [place] [address] Cellar

## 2018-12-23 ENCOUNTER — Telehealth: Payer: Self-pay

## 2018-12-23 NOTE — Telephone Encounter (Signed)
PA submitted via CoverMyMeds to [facility] for sucralfate suspension

## 2018-12-26 ENCOUNTER — Other Ambulatory Visit (HOSPITAL_COMMUNITY): Payer: Self-pay | Admitting: Psychiatry

## 2018-12-28 ENCOUNTER — Encounter: Payer: Self-pay | Admitting: Gastroenterology

## 2018-12-29 MED ORDER — SUCRALFATE 1 G PO TABS: ORAL_TABLET | ORAL | 1 refills | Status: AC

## 2018-12-29 NOTE — Telephone Encounter (Signed)
Prior Auth denied for suspension.  Sent script for tablets with instructions to make a slurry

## 2018-12-29 NOTE — Addendum Note (Signed)
Addended by: Roetta Sessions on: 12/29/2018 06:01 PM   Modules accepted: Orders

## 2019-01-07 ENCOUNTER — Ambulatory Visit (AMBULATORY_SURGERY_CENTER): Payer: BLUE CROSS/BLUE SHIELD | Admitting: Gastroenterology

## 2019-01-07 ENCOUNTER — Encounter: Payer: Self-pay | Admitting: Gastroenterology

## 2019-01-07 VITALS — BP 115/81 | HR 79 | Temp 98.4°F | Resp 16 | Ht 71.0 in | Wt 186.0 lb

## 2019-01-07 DIAGNOSIS — K317 Polyp of stomach and duodenum: Secondary | ICD-10-CM | POA: Diagnosis not present

## 2019-01-07 DIAGNOSIS — K219 Gastro-esophageal reflux disease without esophagitis: Secondary | ICD-10-CM

## 2019-01-07 DIAGNOSIS — K449 Diaphragmatic hernia without obstruction or gangrene: Secondary | ICD-10-CM | POA: Diagnosis not present

## 2019-01-07 DIAGNOSIS — K200 Eosinophilic esophagitis: Secondary | ICD-10-CM

## 2019-01-07 DIAGNOSIS — K209 Esophagitis, unspecified: Secondary | ICD-10-CM | POA: Diagnosis not present

## 2019-01-07 MED ORDER — SODIUM CHLORIDE 0.9 % IV SOLN: 500.0000 mL | Freq: Once | INTRAVENOUS | Status: AC

## 2019-01-07 NOTE — Patient Instructions (Signed)
  Thank you for allowing [place] to care for you today!  Await pathology results, approximately 2 weeks.   Further discussion about TIF procedure after final pathology results.  Resume previous diet and medications today.  Return to normal activities tomorrow.     YOU HAD AN ENDOSCOPIC PROCEDURE TODAY AT [facility]:   Refer to the procedure report that was given to you for any specific questions about what was found during the examination.  If the procedure report does not answer your questions, please call your gastroenterologist to clarify.  If you requested that your care partner not be given the details of your procedure findings, then the procedure report has been included in a sealed envelope for you to review at your convenience later.  YOU SHOULD EXPECT: Some feelings of bloating in the abdomen. Passage of more gas than usual.  Walking can help get rid of the air that was put into your GI tract during the procedure and reduce the bloating. If you had a lower endoscopy (such as a colonoscopy or flexible sigmoidoscopy) you may notice spotting of blood in your stool or on the toilet paper. If you underwent a bowel prep for your procedure, you may not have a normal bowel movement for a few days.  Please Note:  You might notice some irritation and congestion in your nose or some drainage.  This is from the oxygen used during your procedure.  There is no need for concern and it should clear up in a day or so.  SYMPTOMS TO REPORT IMMEDIATELY:    Following upper endoscopy (EGD)  Vomiting of blood or coffee ground material  New chest pain or pain under the shoulder blades  Painful or persistently difficult swallowing  New shortness of breath  Fever of 100F or higher  Black, tarry-looking stools  For urgent or emergent issues, a gastroenterologist can be reached at any hour by calling [phone].   DIET:  We do recommend a small meal at first, but then you may proceed  to your regular diet.  Drink plenty of fluids but you should avoid alcoholic beverages for 24 hours.  ACTIVITY:  You should plan to take it easy for the rest of today and you should NOT DRIVE or use heavy machinery until tomorrow (because of the sedation medicines used during the test).    FOLLOW UP: Our staff will call the number listed on your records the next business day following your procedure to check on you and address any questions or concerns that you may have regarding the information given to you following your procedure. If we do not reach you, we will leave a message.  However, if you are feeling well and you are not experiencing any problems, there is no need to return our call.  We will assume that you have returned to your regular daily activities without incident.  If any biopsies were taken you will be contacted by phone or by letter within the next 1-3 weeks.  Please call us at [phone] if you have not heard about the biopsies in 3 weeks.    SIGNATURES/CONFIDENTIALITY: You and/or your care partner have signed paperwork which will be entered into your electronic medical record.  These signatures attest to the fact that that the information above on your After Visit Summary has been reviewed and is understood.  Full responsibility of the confidentiality of this discharge information lies with you and/or your care-partner.

## 2019-01-07 NOTE — Progress Notes (Signed)
To PACU, VSS. Report to [email or web address] 

## 2019-01-07 NOTE — Op Note (Signed)
[place] Patient Name: James Sloan Procedure Date: 01/07/2019 12:17 PM MRN: [id number] Endoscopist: Remo Lipps P. Havery Moros , MD Age: 38 Referring MD:  Date of Birth: 19-Jun-1981 Gender: Male Account #: [id number] Procedure:                Upper GI endoscopy Indications:              Follow-up of gastro-esophageal reflux disease -                            ongoing symptoms despite protonix 40mg  twice daily,                            assess candidacy for TIF Medicines:                Monitored Anesthesia Care Procedure:                Pre-Anesthesia Assessment:                           - Prior to the procedure, a History and Physical                            was performed, and patient medications and                            allergies were reviewed. The patient's tolerance of                            previous anesthesia was also reviewed. The risks                            and benefits of the procedure and the sedation                            options and risks were discussed with the patient.                            All questions were answered, and informed consent                            was obtained. Prior Anticoagulants: The patient has                            taken no previous anticoagulant or antiplatelet                            agents. ASA Grade Assessment: II - A patient with                            mild systemic disease. After reviewing the risks                            and benefits, the patient was deemed in  satisfactory condition to undergo the procedure.                           After obtaining informed consent, the endoscope was                            passed under direct vision. Throughout the                            procedure, the patient's blood pressure, pulse, and                            oxygen saturations were monitored continuously. The                            Endoscope was introduced  through the mouth, and                            advanced to the second part of duodenum. The upper                            GI endoscopy was accomplished without difficulty.                            The patient tolerated the procedure well. Scope In: Scope Out: Findings:                 Esophagogastric landmarks were identified: the                            Z-line was found at 38 cm, the gastroesophageal                            junction was found at 38 cm and the upper extent of                            the gastric folds was found at 38 cm from the                            incisors. A small sliding hiatal hernia (roughly                            1cm) was noted.                           The exam of the esophagus was otherwise normal.                           Biopsies were taken with a cold forceps in the                            upper third of the esophagus, in the middle third  of the esophagus and in the lower third of the                            esophagus for histology to rule out eosinophilic                            esophagitis.                           Innumerable pedunculated and sessile polyps were                            found in the gastric fundus and in the gastric body                            - ranging from 50mm to > 1cm or so. Suspect benign                            fundic gland polyps. Rasndom biopsies were taken                            from some of the largest lesion with a cold forceps                            for histology.                           The exam of the stomach was otherwise normal.                           The duodenal bulb and second portion of the                            duodenum were normal. Complications:            No immediate complications. Estimated blood loss:                            Minimal. Estimated Blood Loss:     Estimated blood loss was minimal. Impression:               -  Esophagogastric landmarks identified.                           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 Biopsied.                           - Normal duodenal bulb and second portion of the                            duodenum. Recommendation:           - Patient has a contact number available for                            emergencies. The signs and symptoms of potential                            delayed complications were discussed with the                            patient. Return to normal activities tomorrow.                            Written discharge instructions were provided to the                            patient.                           - Resume previous diet.                           - Continue present medications.                           - Await pathology results.                           - I think patient is a good candidate for TIF                            pending biopsies look okay Remo Lipps P. Elina Streng, MD 01/07/2019 12:41:40 PM This report has been signed electronically.

## 2019-01-07 NOTE — Progress Notes (Signed)
Called to room to assist during endoscopic procedure.  Patient ID and intended procedure confirmed with present staff. Received instructions for my participation in the procedure from the performing physician.  

## 2019-01-10 ENCOUNTER — Telehealth: Payer: Self-pay

## 2019-01-10 NOTE — Telephone Encounter (Signed)
  Follow up Call-  Call back number 01/07/2019  Post procedure Call Back phone  # [phone]  Permission to leave phone message Yes     Patient questions:  Do you have a fever, pain , or abdominal swelling? No. Pain Score  0 *  Have you tolerated food without any problems? Yes.    Have you been able to return to your normal activities? Yes.    Do you have any questions about your discharge instructions: Diet   No. Medications  No. Follow up visit  No.  Do you have questions or concerns about your Care? No.  Actions: * If pain score is 4 or above: No action needed, pain <4.

## 2019-01-11 HISTORY — PX: WRIST SURGERY: SHX841

## 2019-01-17 ENCOUNTER — Encounter (HOSPITAL_COMMUNITY): Payer: Self-pay | Admitting: Psychiatry

## 2019-01-17 ENCOUNTER — Ambulatory Visit (HOSPITAL_COMMUNITY): Payer: BLUE CROSS/BLUE SHIELD | Admitting: Psychiatry

## 2019-01-17 ENCOUNTER — Other Ambulatory Visit: Payer: Self-pay

## 2019-01-17 ENCOUNTER — Ambulatory Visit (INDEPENDENT_AMBULATORY_CARE_PROVIDER_SITE_OTHER): Payer: BLUE CROSS/BLUE SHIELD | Admitting: Psychiatry

## 2019-01-17 VITALS — BP 128/78 | HR 111 | Ht 71.0 in | Wt 186.0 lb

## 2019-01-17 DIAGNOSIS — F331 Major depressive disorder, recurrent, moderate: Secondary | ICD-10-CM | POA: Diagnosis not present

## 2019-01-17 DIAGNOSIS — F411 Generalized anxiety disorder: Secondary | ICD-10-CM | POA: Diagnosis not present

## 2019-01-17 DIAGNOSIS — F5102 Adjustment insomnia: Secondary | ICD-10-CM | POA: Diagnosis not present

## 2019-01-17 MED ORDER — SERTRALINE HCL 50 MG PO TABS: 50.0000 mg | ORAL_TABLET | Freq: Every day | ORAL | 0 refills | Status: DC

## 2019-01-17 MED ORDER — TRAZODONE HCL 50 MG PO TABS: ORAL_TABLET | ORAL | 0 refills | Status: DC

## 2019-01-17 MED ORDER — VYVANSE 50 MG PO CAPS: ORAL_CAPSULE | ORAL | 0 refills | Status: DC

## 2019-01-17 MED ORDER — BUPROPION HCL ER (XL) 300 MG PO TB24: 300.0000 mg | ORAL_TABLET | Freq: Every day | ORAL | 0 refills | Status: DC

## 2019-01-17 NOTE — Progress Notes (Signed)
[facility] Outpatient Follow up visit   Patient Identification: James Sloan MRN:  [id number] Date of Evaluation:  01/17/2019 Referral Source: self referral Chief Complaint:   Chief Complaint    Follow-up; Other     Visit Diagnosis:    ICD-10-CM   1. Major depressive disorder, recurrent episode, moderate (HCC) F33.1   2. GAD (generalized anxiety disorder) F41.1   3. Adjustment insomnia F51.02     History of Present Illness:  38 years old white married male. Relocated from [place] , developing care. Had psychiatrist in [place] and diagnosed with depression, anxiety, adhd and insomnia  Returns after 5 months doing fair last visit Zoloft was started Wellbutrin was cut down he has followed at the [facility] in the past  Vyvanse for attention but mostly stays at home take care of the kids since relocation so he is not been using the Vyvanse that often   Duration more than 10 years plus Modifying factor: wife and kids Activity factors as of now no job. Relocation.  Severity of depression is fair and balanced   No side effects reported   Substance Abuse History in the last 12 months:  No.  Consequences of Substance Abuse: NA  Past Medical History:  Past Medical History:  Diagnosis Date  . Anxiety   . Depression   . GERD (gastroesophageal reflux disease)   . Hay fever   . Hypertension   . Lumbar herniated disc     Past Surgical History:  Procedure Laterality Date  . APPENDECTOMY    . KNEE SURGERY Left   . SHOULDER ARTHROCENTESIS Left     Family Psychiatric History: denies   Family History:  Family History  Problem Relation Age of Onset  . Hyperlipidemia Father   . Cancer Maternal Grandmother   . ADD / ADHD Maternal Grandmother   . Cancer Maternal Grandfather   . ADD / ADHD Maternal Grandfather   . Hyperlipidemia Paternal Grandmother   . Stroke Paternal Grandfather   . Hyperlipidemia Paternal Grandfather   . Colon polyps Paternal Grandfather         Numerous  . Colon cancer Neg Hx   . Esophageal cancer Neg Hx   . Stomach cancer Neg Hx   . Rectal cancer Neg Hx     Social History:   Social History   Socioeconomic History  . Marital status: Married    Spouse name: wittiney  . Number of children: 2  . Years of education: Not on file  . Highest education level: Bachelor's degree (e.g., BA, AB, BS)  Occupational History  . Not on file  Social Needs  . Financial resource strain: Not hard at all  . Food insecurity:    Worry: Never true    Inability: Never true  . Transportation needs:    Medical: No    Non-medical: No  Tobacco Use  . Smoking status: Never Smoker  . Smokeless tobacco: Never Used  Substance and Sexual Activity  . Alcohol use: Yes    Alcohol/week: 2.0 - 4.0 standard drinks    Types: 1 - 2 Glasses of wine, 1 - 2 Cans of beer per week    Comment: a few drinks per week  . Drug use: Not Currently    Types: Marijuana  . Sexual activity: Yes    Partners: Female  Lifestyle  . Physical activity:    Days per week: 3 days    Minutes per session: 60 min  . Stress: Not at all  Relationships  . Social connections:    Talks on phone: Once a week    Gets together: Never    Attends religious service: More than 4 times per year    Active member of club or organization: Yes    Attends meetings of clubs or organizations: More than 4 times per year    Relationship status: Married  Other Topics Concern  . Not on file  Social History Narrative  . Not on file    Additional Social History: grew up with parents. Fine . No trauma. Married for 9 years   Allergies:   Allergies  Allergen Reactions  . Iodinated Diagnostic Agents Hives  . Iodine Hives    Metabolic Disorder Labs: Lab Results  Component Value Date   HGBA1C 5.1 08/09/2018   No results found for: PROLACTIN Lab Results  Component Value Date   CHOL 196 08/09/2018   TRIG 197.0 (H) 08/09/2018   HDL 42.80 08/09/2018   CHOLHDL 5 08/09/2018   VLDL 39.4  08/09/2018   LDLCALC 114 (H) 08/09/2018     Current Medications: Current Outpatient Medications  Medication Sig Dispense Refill  . buPROPion (WELLBUTRIN XL) 300 MG 24 hr tablet Take 1 tablet (300 mg total) by mouth daily. 90 tablet 0  . CANNABIDIOL PO Take 15 mg by mouth 1 day or 1 dose.    . hydrOXYzine (ATARAX/VISTARIL) 25 MG tablet Take 25 mg by mouth as needed.    Marland Kitchen losartan (COZAAR) 100 MG tablet Take 1 tablet (100 mg total) by mouth daily. 90 tablet 0  . pantoprazole (PROTONIX) 40 MG tablet Take 1 tablet (40 mg total) by mouth daily. 90 tablet 0  . sertraline (ZOLOFT) 50 MG tablet Take 1 tablet (50 mg total) by mouth daily. 90 tablet 0  . sucralfate (CARAFATE) 1 g tablet Slowly dissolve 1 tablet in 1 tablespoon of distilled water. Do not crush. Drink "slurry" of dissolved tablet every 8 hours as needed 60 tablet 1  . traZODone (DESYREL) 50 MG tablet TAKE 1 TABLET BY MOUTH EVERYDAY AT BEDTIME 30 tablet 0  . VYVANSE 50 MG capsule TAKE 1 CAPSULE BY MOUTH EVERY DAY IN THE MORNING 30 capsule 0   Current Facility-Administered Medications  Medication Dose Route Frequency Provider Last Rate Last Dose  . 0.9 %  sodium chloride infusion  500 mL Intravenous Once Armbruster, Carlota Raspberry, MD          Psychiatric Specialty Exam: Review of Systems  Cardiovascular: Negative for chest pain and palpitations.  Skin: Negative for rash.  Psychiatric/Behavioral: Negative for depression.    Blood pressure 128/78, pulse (!) 111, height 5\' 11"  (1.803 m), weight 186 lb (84.4 kg).Body mass index is 25.94 kg/m.  General Appearance: Casual  Eye Contact:  Fair  Speech:  Slow  Volume:  Normal  Mood:fair  Affect:  Congruent  Thought Process:  Goal Directed  Orientation:  Full (Time, Place, and Person)  Thought Content:  Rumination  Suicidal Thoughts:  No  Homicidal Thoughts:  No  Memory:  Immediate;   Fair Recent;   Fair  Judgement:  Good  Insight:  Good  Psychomotor Activity:  Normal   Concentration:  Concentration: Fair and Attention Span: Fair  Recall:  [employer] of Knowledge:Fair  Language: Fair  Akathisia:  Negative  Handed:  Right  AIMS (if indicated):    Assets:  Desire for Improvement  ADL's:  Intact  Cognition: WNL  Sleep:  Fair on meds    Treatment Plan  Summary: Medication management and Plan as follows  1. Major depressive disorder, mild in remission. Continue wellbutrin 2. FDV:OUZH improved on zoloft. Continue 3. Insomnia: ongoing without trazadone. Will continue   4. ADHD; diagnosed by psychological testing in college. Fair on vyvanse, takes prn Will renew Fu 4-59m. Renewed meds Merian Capron, MD 2/17/20201:10 PM

## 2019-01-28 ENCOUNTER — Ambulatory Visit: Payer: BLUE CROSS/BLUE SHIELD | Admitting: Gastroenterology

## 2019-01-28 ENCOUNTER — Encounter: Payer: Self-pay | Admitting: Gastroenterology

## 2019-01-28 VITALS — BP 126/86 | HR 98 | Ht 71.0 in | Wt 187.0 lb

## 2019-01-28 DIAGNOSIS — K2100 Gastro-esophageal reflux disease with esophagitis, without bleeding: Secondary | ICD-10-CM

## 2019-01-28 DIAGNOSIS — K449 Diaphragmatic hernia without obstruction or gangrene: Secondary | ICD-10-CM

## 2019-01-28 NOTE — Progress Notes (Signed)
P  Chief Complaint:    GERD, hiatal hernia  GI History: 38 year old male with a longstanding history of reflux referred by Dr. Havery Moros for discussion of Transoral Incisionless Fundoplication (TIF) for improved control of reflux and goal of stopping or significantly reducing acid suppression therapy.  Reflux history: Reflux symptoms for at least 18 years, with index symptoms of pyrosis, dyspepsia, regurgitation.  Does also report occasional nausea and intermittent dry cough.  Otherwise denies dysphagia or odynophagia.  Symptoms worse in the morning and postprandial.  He has been trialed on numerous medications for reflux to include Prilosec, Protonix, Nexium, AcipHex, Prevacid, Zantac, and Pepcid.  Has had response to therapy, but eventually symptoms breakthrough despite high-dose acid suppression therapy in the past.  Currently taking Protonix 40 mg p.o. twice daily, taken at correct times and Carafate 10 mL's every 6 hours (new last month).  EGD (01/07/2019, Dr. Havery Moros): Normal esophagus with biopsies demonstrating increased eosinophils in the esophagus consistent with reflux (EoE <15 per hpf), 1 cm hiatal hernia, innumerable fundic gland polyps.  HPI:     Patient is a 38 y.o. male referred by Dr. Havery Moros to discuss TIF as outlined above.  He states, he continues to have breakthrough reflux sxs despite ongoing high dose PPI and Carafate, albeit improved with the current therapy. He is eager to proceed with pre-operative evaluation for TIF for hernia repair and antireflux.   GERD HRQL Questionnaire Score: 19/50 on therapy, 29/30 off therapy   Review of systems:     No chest pain, no SOB, no fevers, no urinary sx   Past Medical History:  Diagnosis Date  . Anxiety   . Depression   . GERD (gastroesophageal reflux disease)   . Hay fever   . Hypertension   . Lumbar herniated disc     Patient's surgical history, family medical history, social history, medications and allergies  were all reviewed in Epic    Current Outpatient Medications  Medication Sig Dispense Refill  . buPROPion (WELLBUTRIN XL) 300 MG 24 hr tablet Take 1 tablet (300 mg total) by mouth daily. 90 tablet 0  . CANNABIDIOL PO Take 15 mg by mouth 1 day or 1 dose.    . hydrOXYzine (ATARAX/VISTARIL) 25 MG tablet Take 25 mg by mouth as needed.    Marland Kitchen losartan (COZAAR) 100 MG tablet Take 1 tablet (100 mg total) by mouth daily. 90 tablet 0  . pantoprazole (PROTONIX) 40 MG tablet Take 1 tablet (40 mg total) by mouth daily. 90 tablet 0  . sertraline (ZOLOFT) 50 MG tablet Take 1 tablet (50 mg total) by mouth daily. 90 tablet 0  . sucralfate (CARAFATE) 1 g tablet Slowly dissolve 1 tablet in 1 tablespoon of distilled water. Do not crush. Drink "slurry" of dissolved tablet every 8 hours as needed 60 tablet 1  . traZODone (DESYREL) 50 MG tablet TAKE 1 TABLET BY MOUTH EVERYDAY AT BEDTIME 30 tablet 0  . VYVANSE 50 MG capsule TAKE 1 CAPSULE BY MOUTH EVERY DAY IN THE MORNING 30 capsule 0   Current Facility-Administered Medications  Medication Dose Route Frequency Provider Last Rate Last Dose  . 0.9 %  sodium chloride infusion  500 mL Intravenous Once Yetta Flock, MD        Physical Exam:     BP 126/86   Pulse 98   Ht 5\' 11"  (1.803 m)   Wt 187 lb (84.8 kg)   BMI 26.08 kg/m   GENERAL:  Pleasant male in NAD  PSYCH: : Cooperative, normal affect EENT:  conjunctiva pink, mucous membranes moist, neck supple without masses ABDOMEN:  Nondistended, soft, nontender. No obvious masses, no hepatomegaly,  normal bowel sounds SKIN:  turgor, no lesions seen Musculoskeletal:  Normal muscle tone, normal strength NEURO: Alert and oriented x 3, no focal neurologic deficits   IMPRESSION and PLAN:    #1. GERD with Esophagitis: James Sloan is a 38 y.o. male with a long-standing history of GERD and histologic evidence of reflux inflammatory changes on recent EGD, incompletely responsive to PPI therapy. He is  requesting minimally invasive hiatal hernia repair and antireflux surgery with goal of improved/resolved symptoms and stopping acid suppression medications. Discussed the pathophysiology of GERD at length, to include the risks of untreated reflux (ie, strictures, Barrett's Esophagus, EAC, etc) as well as the possible treatment with medications vs antireflux surgery. In particular, we discussed the risks, benefits, and alternatives of Transoral Incisionless Fundoplication (TIF), to include Nissen fundoplication, and the patient wishes to proceed with TIF.   - Check barium esophagram for degree of refluxate and additional objective datapoint - Will plan to schedule for TIF at [facility] with plan for subsequent admission overnight for post-operative observation  - NPO at [place] prior to the procedure - Confirmed allergies with the patient and no allergies to planned perioperative antibiotics - Rreviewed postoperative dietary and activity restrictions with patient and provided handout - Anticipated 23 hour stay  - Additional recommendations regarding medications and post-operative diet to follow TIF completion  - All questions answered  - Submitting paperwork for insurance prior auth    #2.  Hiatal Hernia: 1 cm sliding type hernia, requesting minimally invasive repair. Will plan to repair endoscopically with TIF as above.      I spent a total of 25 minutes of face-to-face time with the patient. Greater than 50% of the time was spent counseling and coordinating care.   Lavena Bullion ,DO, FACG 01/28/2019, 11:46 AM

## 2019-01-28 NOTE — Patient Instructions (Addendum)
If you are age 39 or older, your body mass index should be between 23-30. Your Body mass index is 26.08 kg/m. If this is out of the aforementioned range listed, please consider follow up with your Primary Care Provider.  If you are age 23 or younger, your body mass index should be between 19-25. Your Body mass index is 26.08 kg/m. If this is out of the aformentioned range listed, please consider follow up with your Primary Care Provider.   For more information on the TIF procedure please visit the website at [email or web address]  You have been scheduled for a Barium Esophogram at [facility] (1st floor of the hospital) on 02/10/2019 at 11:00am. Please arrive 15 minutes prior to your appointment for registration. Make certain not to have anything to eat or drink 3 hours prior to your test. If you need to reschedule for any reason, please contact radiology at [phone] to do so. __________________________________________________________________ A barium swallow is an examination that concentrates on views of the esophagus. This tends to be a double contrast exam (barium and two liquids which, when combined, create a gas to distend the wall of the oesophagus) or single contrast (non-ionic iodine based). The study is usually tailored to your symptoms so a good history is essential. Attention is paid during the study to the form, structure and configuration of the esophagus, looking for functional disorders (such as aspiration, dysphagia, achalasia, motility and reflux) EXAMINATION You may be asked to change into a gown, depending on the type of swallow being performed. A radiologist and radiographer will perform the procedure. The radiologist will advise you of the type of contrast selected for your procedure and direct you during the exam. You will be asked to stand, sit or lie in several different positions and to hold a small amount of fluid in your mouth before being asked to swallow while the  imaging is performed .In some instances you may be asked to swallow barium coated marshmallows to assess the motility of a solid food bolus. The exam can be recorded as a digital or video fluoroscopy procedure. POST PROCEDURE It will take 1-2 days for the barium to pass through your system. To facilitate this, it is important, unless otherwise directed, to increase your fluids for the next 24-48hrs and to resume your normal diet.  This test typically takes about 30 minutes to perform. __________________________________________________________________________________  It was a pleasure to see you today!  Vito Cirigliano, D.O.

## 2019-02-10 ENCOUNTER — Ambulatory Visit (HOSPITAL_COMMUNITY)
Admission: RE | Admit: 2019-02-10 | Discharge: 2019-02-10 | Disposition: A | Discharge status: Home / Self Care | Payer: BLUE CROSS/BLUE SHIELD | Source: Ambulatory Visit | Attending: Gastroenterology | Admitting: Gastroenterology

## 2019-02-10 ENCOUNTER — Other Ambulatory Visit: Payer: Self-pay

## 2019-02-10 DIAGNOSIS — K2100 Gastro-esophageal reflux disease with esophagitis, without bleeding: Secondary | ICD-10-CM

## 2019-02-17 ENCOUNTER — Other Ambulatory Visit (HOSPITAL_COMMUNITY): Payer: Self-pay | Admitting: Psychiatry

## 2019-02-23 ENCOUNTER — Telehealth (HOSPITAL_COMMUNITY): Payer: Self-pay

## 2019-02-23 MED ORDER — VYVANSE 50 MG PO CAPS: ORAL_CAPSULE | ORAL | 0 refills | Status: DC

## 2019-02-23 NOTE — Telephone Encounter (Signed)
Patient called requesting refill on Vyvanse. Last refill was on 01/17/19. [facility] on [address] in [place]

## 2019-02-23 NOTE — Telephone Encounter (Signed)
sent 

## 2019-03-03 ENCOUNTER — Other Ambulatory Visit: Payer: Self-pay | Admitting: Family Medicine

## 2019-03-03 ENCOUNTER — Encounter: Payer: Self-pay | Admitting: Family Medicine

## 2019-03-04 ENCOUNTER — Other Ambulatory Visit: Payer: Self-pay

## 2019-03-04 ENCOUNTER — Ambulatory Visit (INDEPENDENT_AMBULATORY_CARE_PROVIDER_SITE_OTHER): Payer: BLUE CROSS/BLUE SHIELD | Admitting: Family Medicine

## 2019-03-04 ENCOUNTER — Encounter: Payer: Self-pay | Admitting: Family Medicine

## 2019-03-04 DIAGNOSIS — J302 Other seasonal allergic rhinitis: Secondary | ICD-10-CM | POA: Insufficient documentation

## 2019-03-04 MED ORDER — CETIRIZINE HCL 10 MG PO TABS: 10.0000 mg | ORAL_TABLET | Freq: Every day | ORAL | 11 refills | Status: DC

## 2019-03-04 MED ORDER — FLUTICASONE PROPIONATE 50 MCG/ACT NA SUSP: 1.0000 | Freq: Every day | NASAL | 5 refills | Status: DC

## 2019-03-04 NOTE — Progress Notes (Signed)
Virtual Visit via Video Note  I connected with James Sloan on 03/04/19 at  3:00 PM EDT by a video enabled telemedicine application and verified that I am speaking with the correct person using two identifiers.  Location patient: home Location provider:home office Persons participating in the virtual visit: patient, provider  I discussed the limitations of evaluation and management by telemedicine and the availability of in person appointments. The patient expressed understanding and agreed to proceed.   HPI: Pt notes allergies bothering him.  Since moving from the [place] had been doing ok, but notes the layer of pollen outside is now causing problems.  Pt endorses mild cough, rhinorrhea, itchy/watery eyes.  Pt started taking an expired rx for flonase, OTC allergy eye gtts, and trying OTC allergy meds with a little relief.  Pt denies fever, chills, sore throat, N/V, SOB.  ROS: See pertinent positives and negatives per HPI.  Past Medical History:  Diagnosis Date  . Anxiety   . Depression   . GERD (gastroesophageal reflux disease)   . Hay fever   . Hypertension   . Lumbar herniated disc     Past Surgical History:  Procedure Laterality Date  . APPENDECTOMY    . KNEE SURGERY Left   . SHOULDER ARTHROCENTESIS Left   . WRIST SURGERY Left 01/11/2019   [facility]     Family History  Problem Relation Age of Onset  . Hyperlipidemia Father   . Cancer Maternal Grandmother   . ADD / ADHD Maternal Grandmother   . Cancer Maternal Grandfather   . ADD / ADHD Maternal Grandfather   . Hyperlipidemia Paternal Grandmother   . Stroke Paternal Grandfather   . Hyperlipidemia Paternal Grandfather   . Colon polyps Paternal Grandfather        Numerous  . Colon cancer Neg Hx   . Esophageal cancer Neg Hx   . Stomach cancer Neg Hx   . Rectal cancer Neg Hx     SOCIAL HX:   Current Outpatient Medications:  .  buPROPion (WELLBUTRIN XL) 300 MG 24 hr tablet, Take 1 tablet (300 mg total) by mouth  daily., Disp: 90 tablet, Rfl: 0 .  CANNABIDIOL PO, Take 15 mg by mouth 1 day or 1 dose., Disp: , Rfl:  .  hydrOXYzine (ATARAX/VISTARIL) 25 MG tablet, Take 25 mg by mouth as needed., Disp: , Rfl:  .  losartan (COZAAR) 100 MG tablet, Take 1 tablet (100 mg total) by mouth daily., Disp: 90 tablet, Rfl: 0 .  pantoprazole (PROTONIX) 40 MG tablet, TAKE 1 TABLET BY MOUTH EVERY DAY, Disp: 90 tablet, Rfl: 0 .  sertraline (ZOLOFT) 50 MG tablet, Take 1 tablet (50 mg total) by mouth daily., Disp: 90 tablet, Rfl: 0 .  sucralfate (CARAFATE) 1 g tablet, Slowly dissolve 1 tablet in 1 tablespoon of distilled water. Do not crush. Drink "slurry" of dissolved tablet every 8 hours as needed, Disp: 60 tablet, Rfl: 1 .  traZODone (DESYREL) 50 MG tablet, TAKE 1 TABLET BY MOUTH EVERYDAY AT BEDTIME, Disp: 30 tablet, Rfl: 0 .  VYVANSE 50 MG capsule, TAKE 1 CAPSULE BY MOUTH EVERY DAY IN THE MORNING, Disp: 30 capsule, Rfl: 0  Current Facility-Administered Medications:  .  0.9 %  sodium chloride infusion, 500 mL, Intravenous, Once, Armbruster, Carlota Raspberry, MD  EXAM:  VITALS per patient if applicable:  RR between 12 to 20 bpm  GENERAL: alert, oriented, appears well and in no acute distress  HEENT: atraumatic, conjunctiva clear, no obvious abnormalities on inspection of  external nose and ears  NECK: normal movements of the head and neck  LUNGS: on inspection no signs of respiratory distress, breathing rate appears normal, no obvious gross SOB, gasping or wheezing  CV: no obvious cyanosis  MS: moves all visible extremities without noticeable abnormality  PSYCH/NEURO: pleasant and cooperative, no obvious depression or anxiety, speech and thought processing grossly intact  ASSESSMENT AND PLAN:  Discussed the following assessment and plan:  Seasonal allergies  -discussed ways to reduce symptoms -ok to continue allergy eye gtts -advised may have to try a different allergy medication if symptoms continue. - Plan:  fluticasone (FLONASE) 50 MCG/ACT nasal spray, cetirizine (ZYRTEC) 10 MG tablet  I discussed the assessment and treatment plan with the patient. The patient was provided an opportunity to ask questions and all were answered. The patient agreed with the plan and demonstrated an understanding of the instructions.   The patient was advised to call back or seek an in-person evaluation if the symptoms worsen or if the condition fails to improve as anticipated.  James Ruddy, MD

## 2019-03-14 ENCOUNTER — Other Ambulatory Visit (HOSPITAL_COMMUNITY): Payer: Self-pay | Admitting: Psychiatry

## 2019-03-19 ENCOUNTER — Other Ambulatory Visit: Payer: Self-pay | Admitting: Family Medicine

## 2019-03-29 ENCOUNTER — Telehealth (HOSPITAL_COMMUNITY): Payer: Self-pay

## 2019-03-29 MED ORDER — VYVANSE 50 MG PO CAPS: ORAL_CAPSULE | ORAL | 0 refills | Status: DC

## 2019-03-29 NOTE — Telephone Encounter (Signed)
sent 

## 2019-03-29 NOTE — Telephone Encounter (Signed)
Patient called requesting a refill on [facility]vanse. [facility] [address] [facility] [place]

## 2019-04-10 ENCOUNTER — Other Ambulatory Visit (HOSPITAL_COMMUNITY): Payer: Self-pay | Admitting: Psychiatry

## 2019-04-13 ENCOUNTER — Other Ambulatory Visit (HOSPITAL_COMMUNITY): Payer: Self-pay | Admitting: Psychiatry

## 2019-04-18 ENCOUNTER — Other Ambulatory Visit (HOSPITAL_COMMUNITY): Payer: Self-pay | Admitting: Psychiatry

## 2019-05-02 ENCOUNTER — Telehealth (HOSPITAL_COMMUNITY): Payer: Self-pay

## 2019-05-02 MED ORDER — VYVANSE 50 MG PO CAPS: ORAL_CAPSULE | ORAL | 0 refills | Status: DC

## 2019-05-02 NOTE — Telephone Encounter (Signed)
sent 

## 2019-05-02 NOTE — Telephone Encounter (Signed)
Patient called requesting a refill on V[facility]vanse. [facility] [address] in [place]

## 2019-05-27 ENCOUNTER — Other Ambulatory Visit: Payer: Self-pay | Admitting: Family Medicine

## 2019-05-31 ENCOUNTER — Telehealth (HOSPITAL_COMMUNITY): Payer: Self-pay

## 2019-05-31 MED ORDER — VYVANSE 50 MG PO CAPS: ORAL_CAPSULE | ORAL | 0 refills | Status: DC

## 2019-05-31 NOTE — Telephone Encounter (Signed)
Patient called to request a refill on Vyvanse. [facility] [address] in [place]

## 2019-05-31 NOTE — Telephone Encounter (Signed)
sent 

## 2019-06-13 ENCOUNTER — Other Ambulatory Visit: Payer: Self-pay | Admitting: Family Medicine

## 2019-06-20 ENCOUNTER — Ambulatory Visit (INDEPENDENT_AMBULATORY_CARE_PROVIDER_SITE_OTHER): Payer: BC Managed Care – PPO | Admitting: Family Medicine

## 2019-06-20 ENCOUNTER — Other Ambulatory Visit: Payer: Self-pay

## 2019-06-20 ENCOUNTER — Encounter: Payer: Self-pay | Admitting: Family Medicine

## 2019-06-20 DIAGNOSIS — G629 Polyneuropathy, unspecified: Secondary | ICD-10-CM | POA: Insufficient documentation

## 2019-06-20 NOTE — Progress Notes (Signed)
Virtual Visit via Video Note  I connected with James Sloan on 06/20/19 at  3:00 PM EDT by a video enabled telemedicine application and verified that I am speaking with the correct person using two identifiers.  Location patient: home Location provider:work or home office Persons participating in the virtual visit: patient, provider  I discussed the limitations of evaluation and management by telemedicine and the availability of in person appointments. The patient expressed understanding and agreed to proceed.   HPI: Pt is a 38 yo male with pmh sig for GERD, seasonal allergies, HTN, ADHD, h/o anxiety and depression.  Pt cut his L thumb by the IP joint, had stiches at UC ~ 1 month ago.  Notes pins and needles sensation that started a few wks ago.  The feeling is intermittent.  Also has mild weakness in the thumb when the sensation happens.  There is an occasional numbness in the L index finger with radiation into L forearm.  Pt denies loss of ROM in thumb, erythema, edema, deformity.   ROS: See pertinent positives and negatives per HPI.  Past Medical History:  Diagnosis Date  . Anxiety   . Depression   . GERD (gastroesophageal reflux disease)   . Hay fever   . Hypertension   . Lumbar herniated disc     Past Surgical History:  Procedure Laterality Date  . APPENDECTOMY    . KNEE SURGERY Left   . SHOULDER ARTHROCENTESIS Left   . WRIST SURGERY Left 01/11/2019   [facility]     Family History  Problem Relation Age of Onset  . Hyperlipidemia Father   . Cancer Maternal Grandmother   . ADD / ADHD Maternal Grandmother   . Cancer Maternal Grandfather   . ADD / ADHD Maternal Grandfather   . Hyperlipidemia Paternal Grandmother   . Stroke Paternal Grandfather   . Hyperlipidemia Paternal Grandfather   . Colon polyps Paternal Grandfather        Numerous  . Colon cancer Neg Hx   . Esophageal cancer Neg Hx   . Stomach cancer Neg Hx   . Rectal cancer Neg Hx      Current Outpatient  Medications:  .  buPROPion (WELLBUTRIN XL) 300 MG 24 hr tablet, TAKE 1 TABLET BY MOUTH EVERY DAY, Disp: 90 tablet, Rfl: 0 .  CANNABIDIOL PO, Take 15 mg by mouth 1 day or 1 dose., Disp: , Rfl:  .  cetirizine (ZYRTEC) 10 MG tablet, Take 1 tablet (10 mg total) by mouth daily., Disp: 30 tablet, Rfl: 11 .  fluticasone (FLONASE) 50 MCG/ACT nasal spray, Place 1 spray into both nostrils daily., Disp: 16 g, Rfl: 5 .  hydrOXYzine (ATARAX/VISTARIL) 25 MG tablet, Take 25 mg by mouth as needed., Disp: , Rfl:  .  losartan (COZAAR) 100 MG tablet, TAKE 1 TABLET BY MOUTH EVERY DAY, Disp: 90 tablet, Rfl: 0 .  pantoprazole (PROTONIX) 40 MG tablet, TAKE 1 TABLET BY MOUTH EVERY DAY, Disp: 90 tablet, Rfl: 0 .  sertraline (ZOLOFT) 50 MG tablet, TAKE 1 TABLET BY MOUTH EVERY DAY, Disp: 90 tablet, Rfl: 0 .  sucralfate (CARAFATE) 1 g tablet, Slowly dissolve 1 tablet in 1 tablespoon of distilled water. Do not crush. Drink "slurry" of dissolved tablet every 8 hours as needed, Disp: 60 tablet, Rfl: 1 .  traZODone (DESYREL) 50 MG tablet, TAKE 1 TABLET BY MOUTH EVERYDAY AT BEDTIME, Disp: 90 tablet, Rfl: 0 .  VYVANSE 50 MG capsule, TAKE 1 CAPSULE BY MOUTH EVERY DAY IN THE MORNING,  Disp: 30 capsule, Rfl: 0  Current Facility-Administered Medications:  .  0.9 %  sodium chloride infusion, 500 mL, Intravenous, Once, Armbruster, Carlota Raspberry, MD  EXAM:  VITALS per patient if applicable: RR between 16-10 bpm  GENERAL: alert, oriented, appears well and in no acute distress  HEENT: atraumatic, conjunctiva clear, no obvious abnormalities on inspection of external nose and ears  NECK: normal movements of the head and neck  LUNGS: on inspection no signs of respiratory distress, breathing rate appears normal, no obvious gross SOB, gasping or wheezing  CV: no obvious cyanosis  MS: Well healed cut on dorsum of L thumb across IP joint.  No contracture, erythema, edema, or deformity noted.  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 -discussed treatment options including gabapentin.  Pt declines medication at this time. -pt to f/u if symptom increase in frequency or intensity. -will continue to monitor.  F/u prn   I discussed the assessment and treatment plan with the patient. The patient was provided an opportunity to ask questions and all were answered. The patient agreed with the plan and demonstrated an understanding of the instructions.   The patient was advised to call back or seek an in-person evaluation if the symptoms worsen or if the condition fails to improve as anticipated.   Billie Ruddy, MD

## 2019-07-01 ENCOUNTER — Telehealth (HOSPITAL_COMMUNITY): Payer: Self-pay

## 2019-07-01 ENCOUNTER — Other Ambulatory Visit (HOSPITAL_COMMUNITY): Payer: Self-pay | Admitting: Psychiatry

## 2019-07-01 MED ORDER — VYVANSE 50 MG PO CAPS: ORAL_CAPSULE | ORAL | 0 refills | Status: DC

## 2019-07-01 NOTE — Telephone Encounter (Signed)
Rx sent 

## 2019-07-01 NOTE — Telephone Encounter (Signed)
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 He is requesting a refill on Vyvanse. [facility] [address] in [place]. Thanks

## 2019-07-08 ENCOUNTER — Encounter: Payer: Self-pay | Admitting: Family Medicine

## 2019-07-10 ENCOUNTER — Other Ambulatory Visit (HOSPITAL_COMMUNITY): Payer: Self-pay | Admitting: Psychiatry

## 2019-07-11 ENCOUNTER — Encounter (HOSPITAL_COMMUNITY): Payer: Self-pay | Admitting: Psychiatry

## 2019-07-11 ENCOUNTER — Ambulatory Visit (INDEPENDENT_AMBULATORY_CARE_PROVIDER_SITE_OTHER): Payer: BC Managed Care – PPO | Admitting: Psychiatry

## 2019-07-11 DIAGNOSIS — F900 Attention-deficit hyperactivity disorder, predominantly inattentive type: Secondary | ICD-10-CM | POA: Diagnosis not present

## 2019-07-11 DIAGNOSIS — F5102 Adjustment insomnia: Secondary | ICD-10-CM | POA: Diagnosis not present

## 2019-07-11 DIAGNOSIS — F331 Major depressive disorder, recurrent, moderate: Secondary | ICD-10-CM | POA: Diagnosis not present

## 2019-07-11 DIAGNOSIS — F411 Generalized anxiety disorder: Secondary | ICD-10-CM | POA: Diagnosis not present

## 2019-07-11 NOTE — Progress Notes (Signed)
[facility] Outpatient Follow up visit   Patient Identification: James Sloan MRN:  [id number] Date of Evaluation:  07/11/2019 Referral Source: self referral Chief Complaint:   depression follow up Visit Diagnosis:    ICD-10-CM   1. Major depressive disorder, recurrent episode, moderate (HCC)  F33.1   2. GAD (generalized anxiety disorder)  F41.1   3. Adjustment insomnia  F51.02   4. Attention deficit hyperactivity disorder (ADHD), predominantly inattentive type  F90.0    Virtual Visit via Telephone Note  I connected with [name] on 07/11/19 at  1:30 PM EDT by telephone and verified that I am speaking with the correct person using two identifiers.   I discussed the limitations, risks, security and privacy concerns of performing an evaluation and management service by telephone and the availability of in person appointments. I also discussed with the patient that there may be a patient responsible charge related to this service. The patient expressed understanding and agreed to proceed.   I discussed the assessment and treatment plan with the patient. The patient was provided an opportunity to ask questions and all were answered. The patient agreed with the plan and demonstrated an understanding of the instructions.   The patient was advised to call back or seek an in-person evaluation if the symptoms worsen or if the condition fails to improve as anticipated. History of Present Illness:  38 years old white married male. Relocated from [place].  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 Relocation.  Severity of depression is fair and balanced   No side effects reported   Substance Abuse History in the last 12 months:  No.  Consequences of Substance  Abuse: NA  Past Medical History:  Past Medical History:  Diagnosis Date  . Anxiety   . Depression   . GERD (gastroesophageal reflux disease)   . Hay fever   . Hypertension   . Lumbar herniated disc     Past Surgical History:  Procedure Laterality Date  . APPENDECTOMY    . KNEE SURGERY Left   . SHOULDER ARTHROCENTESIS Left   . WRIST SURGERY Left 01/11/2019   [facility]     Family Psychiatric History: denies   Family History:  Family History  Problem Relation Age of Onset  . Hyperlipidemia Father   . Cancer Maternal Grandmother   . ADD / ADHD Maternal Grandmother   . Cancer Maternal Grandfather   . ADD / ADHD Maternal Grandfather   . Hyperlipidemia Paternal Grandmother   . Stroke Paternal Grandfather   . Hyperlipidemia Paternal Grandfather   . Colon polyps Paternal Grandfather        Numerous  . Colon cancer Neg Hx   . Esophageal cancer Neg Hx   . Stomach cancer Neg Hx   . Rectal cancer Neg Hx     Social History:   Social History   Socioeconomic History  . Marital status: Married    Spouse name: James Sloan  . Number of children: 2  . Years of education: Not on file  . Highest education level: Bachelor's degree (e.g., BA, AB, BS)  Occupational History  . Not on file  Social Needs  . Financial resource strain: Not hard at all  . Food insecurity    Worry: Never true    Inability: Never true  .  Transportation needs    Medical: No    Non-medical: No  Tobacco Use  . Smoking status: Never Smoker  . Smokeless tobacco: Never Used  Substance and Sexual Activity  . Alcohol use: Yes    Alcohol/week: 2.0 - 4.0 standard drinks    Types: 1 - 2 Glasses of wine, 1 - 2 Cans of beer per week    Comment: a few drinks per week  . Drug use: Not Currently    Types: Marijuana  . Sexual activity: Yes    Partners: Female  Lifestyle  . Physical activity    Days per week: 3 days    Minutes per session: 60 min  . Stress: Not at all  Relationships  . Social Product manager on phone: Once a week    Gets together: Never    Attends religious service: More than 4 times per year    Active member of club or organization: Yes    Attends meetings of clubs or organizations: More than 4 times per year    Relationship status: Married  Other Topics Concern  . Not on file  Social History Narrative  . Not on file    Additional Social History: grew up with parents. Fine . No trauma. Married for 9 years   Allergies:   Allergies  Allergen Reactions  . Iodinated Diagnostic Agents Hives  . Iodine Hives    Metabolic Disorder Labs: Lab Results  Component Value Date   HGBA1C 5.1 08/09/2018   No results found for: PROLACTIN Lab Results  Component Value Date   CHOL 196 08/09/2018   TRIG 197.0 (H) 08/09/2018   HDL 42.80 08/09/2018   CHOLHDL 5 08/09/2018   VLDL 39.4 08/09/2018   LDLCALC 114 (H) 08/09/2018     Current Medications: Current Outpatient Medications  Medication Sig Dispense Refill  . buPROPion (WELLBUTRIN XL) 300 MG 24 hr tablet TAKE 1 TABLET BY MOUTH EVERY DAY 90 tablet 0  . CANNABIDIOL PO Take 15 mg by mouth 1 day or 1 dose.    . cetirizine (ZYRTEC) 10 MG tablet Take 1 tablet (10 mg total) by mouth daily. 30 tablet 11  . fluticasone (FLONASE) 50 MCG/ACT nasal spray Place 1 spray into both nostrils daily. 16 g 5  . hydrOXYzine (ATARAX/VISTARIL) 25 MG tablet Take 25 mg by mouth as needed.    Marland Kitchen losartan (COZAAR) 100 MG tablet TAKE 1 TABLET BY MOUTH EVERY DAY 90 tablet 0  . pantoprazole (PROTONIX) 40 MG tablet TAKE 1 TABLET BY MOUTH EVERY DAY 90 tablet 0  . sertraline (ZOLOFT) 50 MG tablet TAKE 1 TABLET BY MOUTH EVERY DAY 90 tablet 0  . sucralfate (CARAFATE) 1 g tablet Slowly dissolve 1 tablet in 1 tablespoon of distilled water. Do not crush. Drink "slurry" of dissolved tablet every 8 hours as needed 60 tablet 1  . traZODone (DESYREL) 50 MG tablet TAKE 1 TABLET BY MOUTH EVERYDAY AT BEDTIME 90 tablet 0  . VYVANSE 50 MG capsule TAKE 1 CAPSULE  BY MOUTH EVERY DAY IN THE MORNING 30 capsule 0   Current Facility-Administered Medications  Medication Dose Route Frequency Provider Last Rate Last Dose  . 0.9 %  sodium chloride infusion  500 mL Intravenous Once Armbruster, Carlota Raspberry, MD          Psychiatric Specialty Exam: Review of Systems  Cardiovascular: Negative for chest pain and palpitations.  Skin: Negative for rash.  Psychiatric/Behavioral: Negative for depression.    There were no  vitals taken for this visit.There is no height or weight on file to calculate BMI.  General Appearance: Casual  Eye Contact:  Fair  Speech:  Slow  Volume:  Normal  Mood: fair  Affect:  Congruent  Thought Process:  Goal Directed  Orientation:  Full (Time, Place, and Person)  Thought Content:  Rumination  Suicidal Thoughts:  No  Homicidal Thoughts:  No  Memory:  Immediate;   Fair Recent;   Fair  Judgement:  Good  Insight:  Good  Psychomotor Activity:  Normal  Concentration:  Concentration: Fair and Attention Span: Fair  Recall:  [employer] of Knowledge:Fair  Language: Fair  Akathisia:  Negative  Handed:  Right  AIMS (if indicated):    Assets:  Desire for Improvement  ADL's:  Intact  Cognition: WNL  Sleep:  Fair on meds    Treatment Plan Summary: Medication management and Plan as follows  1. Major depressive disorder, in remission. Continue zoloft and wellbutrin 2. 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 Continue 3. Insomnia: ongoing without trazadone. Will continue   4. ADHD; diagnosed by psychological testing in college. Fair on vyvanse, takes prn Has meds for now Fu 4-41m.Merian Capron, MD 8/10/20201:39 PM

## 2019-07-19 ENCOUNTER — Other Ambulatory Visit (HOSPITAL_COMMUNITY): Payer: Self-pay

## 2019-07-19 MED ORDER — TRAZODONE HCL 50 MG PO TABS: ORAL_TABLET | ORAL | 0 refills | Status: DC

## 2019-07-19 MED ORDER — BUPROPION HCL ER (XL) 300 MG PO TB24: 300.0000 mg | ORAL_TABLET | Freq: Every day | ORAL | 0 refills | Status: DC

## 2019-07-20 ENCOUNTER — Other Ambulatory Visit (HOSPITAL_COMMUNITY): Payer: Self-pay | Admitting: Psychiatry

## 2019-08-03 ENCOUNTER — Telehealth (HOSPITAL_COMMUNITY): Payer: Self-pay

## 2019-08-03 MED ORDER — VYVANSE 50 MG PO CAPS: ORAL_CAPSULE | ORAL | 0 refills | Status: DC

## 2019-08-03 NOTE — Telephone Encounter (Signed)
sent 

## 2019-08-03 NOTE — Telephone Encounter (Signed)
Patient needs a refill on [facility]vanse. [facility] [address] in [place]

## 2019-08-20 ENCOUNTER — Other Ambulatory Visit: Payer: Self-pay | Admitting: Family Medicine

## 2019-09-02 ENCOUNTER — Telehealth (HOSPITAL_COMMUNITY): Payer: Self-pay | Admitting: Psychiatry

## 2019-09-02 MED ORDER — VYVANSE 50 MG PO CAPS: ORAL_CAPSULE | ORAL | 0 refills | Status: DC

## 2019-09-02 NOTE — Telephone Encounter (Signed)
Pt [facility] [place]

## 2019-09-02 NOTE — Telephone Encounter (Signed)
sent 

## 2019-09-13 ENCOUNTER — Other Ambulatory Visit: Payer: Self-pay | Admitting: Family Medicine

## 2019-10-06 ENCOUNTER — Telehealth (HOSPITAL_COMMUNITY): Payer: Self-pay

## 2019-10-06 MED ORDER — VYVANSE 50 MG PO CAPS: ORAL_CAPSULE | ORAL | 0 refills | Status: DC

## 2019-10-06 NOTE — Telephone Encounter (Signed)
sent 

## 2019-10-06 NOTE — Telephone Encounter (Signed)
Patient needs refill on [facility] [address] in [place]

## 2019-11-03 ENCOUNTER — Other Ambulatory Visit (HOSPITAL_COMMUNITY): Payer: Self-pay | Admitting: Psychiatry

## 2019-11-07 ENCOUNTER — Telehealth (HOSPITAL_COMMUNITY): Payer: Self-pay | Admitting: Psychiatry

## 2019-11-07 MED ORDER — VYVANSE 50 MG PO CAPS: ORAL_CAPSULE | ORAL | 0 refills | Status: DC

## 2019-11-07 NOTE — Telephone Encounter (Signed)
sent 

## 2019-11-07 NOTE — Telephone Encounter (Signed)
Pt needs refill on vyvanse.  [facility] [address]  [facility] [phone]

## 2019-11-16 ENCOUNTER — Other Ambulatory Visit (HOSPITAL_COMMUNITY): Payer: Self-pay | Admitting: Psychiatry

## 2019-11-18 ENCOUNTER — Other Ambulatory Visit: Payer: Self-pay | Admitting: Family Medicine

## 2019-11-23 ENCOUNTER — Other Ambulatory Visit (HOSPITAL_COMMUNITY): Payer: Self-pay | Admitting: Psychiatry

## 2019-12-08 ENCOUNTER — Telehealth (HOSPITAL_COMMUNITY): Payer: Self-pay

## 2019-12-08 MED ORDER — VYVANSE 50 MG PO CAPS: ORAL_CAPSULE | ORAL | 0 refills | Status: DC

## 2019-12-08 NOTE — Telephone Encounter (Signed)
Patient called requesting a refill on [facility]vanse. [facility] [address]

## 2019-12-12 ENCOUNTER — Other Ambulatory Visit: Payer: Self-pay | Admitting: Family Medicine

## 2020-01-06 ENCOUNTER — Telehealth (HOSPITAL_COMMUNITY): Payer: Self-pay

## 2020-01-06 MED ORDER — VYVANSE 50 MG PO CAPS: ORAL_CAPSULE | ORAL | 0 refills | Status: DC

## 2020-01-06 NOTE — Telephone Encounter (Signed)
Patient needs refill on [facility]vanse [facility]

## 2020-01-06 NOTE — Telephone Encounter (Signed)
sent 

## 2020-01-29 ENCOUNTER — Other Ambulatory Visit (HOSPITAL_COMMUNITY): Payer: Self-pay | Admitting: Psychiatry

## 2020-02-02 ENCOUNTER — Telehealth (HOSPITAL_COMMUNITY): Payer: Self-pay | Admitting: Psychiatry

## 2020-02-02 MED ORDER — VYVANSE 50 MG PO CAPS: ORAL_CAPSULE | ORAL | 0 refills | Status: DC

## 2020-02-02 NOTE — Telephone Encounter (Signed)
Pt needs refill on vyvanse.  [facility] [address]  [address] [phone]

## 2020-02-02 NOTE — Telephone Encounter (Signed)
sent 

## 2020-02-11 ENCOUNTER — Other Ambulatory Visit: Payer: Self-pay | Admitting: Family Medicine

## 2020-02-11 ENCOUNTER — Other Ambulatory Visit (HOSPITAL_COMMUNITY): Payer: Self-pay | Admitting: Psychiatry

## 2020-02-14 ENCOUNTER — Other Ambulatory Visit (HOSPITAL_COMMUNITY): Payer: Self-pay | Admitting: Psychiatry

## 2020-02-22 ENCOUNTER — Other Ambulatory Visit: Payer: Self-pay | Admitting: Family Medicine

## 2020-02-22 DIAGNOSIS — J302 Other seasonal allergic rhinitis: Secondary | ICD-10-CM

## 2020-03-05 ENCOUNTER — Other Ambulatory Visit (HOSPITAL_COMMUNITY): Payer: Self-pay | Admitting: Psychiatry

## 2020-03-05 ENCOUNTER — Telehealth (HOSPITAL_COMMUNITY): Payer: Self-pay

## 2020-03-05 MED ORDER — VYVANSE 50 MG PO CAPS: ORAL_CAPSULE | ORAL | 0 refills | Status: DC

## 2020-03-05 NOTE — Telephone Encounter (Signed)
sent 

## 2020-03-05 NOTE — Telephone Encounter (Signed)
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

## 2020-03-09 ENCOUNTER — Other Ambulatory Visit: Payer: Self-pay | Admitting: Family Medicine

## 2020-04-04 ENCOUNTER — Telehealth (HOSPITAL_COMMUNITY): Payer: Self-pay

## 2020-04-04 MED ORDER — VYVANSE 50 MG PO CAPS: ORAL_CAPSULE | ORAL | 0 refills | Status: DC

## 2020-04-04 NOTE — Telephone Encounter (Signed)
sent 

## 2020-04-04 NOTE — Telephone Encounter (Signed)
Patient needs refill on [facility]se [facility] [address] in [place]

## 2020-04-05 ENCOUNTER — Other Ambulatory Visit: Payer: Self-pay | Admitting: Family Medicine

## 2020-04-05 DIAGNOSIS — J302 Other seasonal allergic rhinitis: Secondary | ICD-10-CM

## 2020-04-27 ENCOUNTER — Other Ambulatory Visit (HOSPITAL_COMMUNITY): Payer: Self-pay | Admitting: Psychiatry

## 2020-05-07 ENCOUNTER — Telehealth (INDEPENDENT_AMBULATORY_CARE_PROVIDER_SITE_OTHER): Payer: Self-pay | Admitting: Psychiatry

## 2020-05-07 ENCOUNTER — Encounter (HOSPITAL_COMMUNITY): Payer: Self-pay | Admitting: Psychiatry

## 2020-05-07 DIAGNOSIS — F331 Major depressive disorder, recurrent, moderate: Secondary | ICD-10-CM

## 2020-05-07 DIAGNOSIS — F411 Generalized anxiety disorder: Secondary | ICD-10-CM

## 2020-05-07 DIAGNOSIS — F900 Attention-deficit hyperactivity disorder, predominantly inattentive type: Secondary | ICD-10-CM

## 2020-05-07 DIAGNOSIS — F5102 Adjustment insomnia: Secondary | ICD-10-CM

## 2020-05-07 MED ORDER — VYVANSE 50 MG PO CAPS: ORAL_CAPSULE | ORAL | 0 refills | Status: DC

## 2020-05-07 NOTE — Progress Notes (Addendum)
[facility] Outpatient Follow up visit   Patient Identification: James Sloan MRN:  [id number] Date of Evaluation:  05/07/2020 Referral Source: self referral Chief Complaint:   depression follow up Visit Diagnosis:    ICD-10-CM   1. Major depressive disorder, recurrent episode, moderate (HCC)  F33.1   2. GAD (generalized anxiety disorder)  F41.1   3. Adjustment insomnia  F51.02   4. Attention deficit hyperactivity disorder (ADHD), predominantly inattentive type  F90.0    Virtual Visit via Telephone Note   I connected with James Sloan on 05/07/20 at  1:30 PM EDT by telephone and verified that I am speaking with the correct person using two identifiers. I discussed the limitations, risks, security and privacy concerns of performing an evaluation and management service by telephone and the availability of in person appointments. I also discussed with the patient that there may be a patient responsible charge related to this service. The patient expressed understanding and agreed to proceed.   I discussed the assessment and treatment plan with the patient. The patient was provided an opportunity to ask questions and all were answered. The patient agreed with the plan and demonstrated an understanding of the instructions.   The patient was advised to call back or seek an in-person evaluation if the symptoms worsen or if the condition fails to improve as anticipated.  Patient location: home Provider location: home History of Present Illness:  39 years old white married male. Relocated from [place].  Doing fair, has got a job, vyvanse helps with inattention. o side effects Wife is supportive Doing fair regarding depression   Duration more than 10 years plus Modifying factor: wife and kids Activity factors as of now no job. Relocation.  Severity of depression is fair and balanced   No side effects reported   Substance Abuse History in the last 12 months:  No.  Consequences  of Substance Abuse: NA  Past Medical History:  Past Medical History:  Diagnosis Date  . Anxiety   . Depression   . GERD (gastroesophageal reflux disease)   . Hay fever   . Hypertension   . Lumbar herniated disc     Past Surgical History:  Procedure Laterality Date  . APPENDECTOMY    . KNEE SURGERY Left   . SHOULDER ARTHROCENTESIS Left   . WRIST SURGERY Left 01/11/2019   emerge[facility]o     Family Psychiatric History: denies   Family History:  Family History  Problem Relation Age of Onset  . Hyperlipidemia Father   . Cancer Maternal Grandmother   . ADD / ADHD Maternal Grandmother   . Cancer Maternal Grandfather   . ADD / ADHD Maternal Grandfather   . Hyperlipidemia Paternal Grandmother   . Stroke Paternal Grandfather   . Hyperlipidemia Paternal Grandfather   . Colon polyps Paternal Grandfather        Numerous  . Colon cancer Neg Hx   . Esophageal cancer Neg Hx   . Stomach cancer Neg Hx   . Rectal cancer Neg Hx     Social History:   Social History   Socioeconomic History  . Marital status: Married    Spouse name: James Sloan  . Number of children: 2  . Years of education: Not on file  . Highest education level: Bachelor's degree (e.g., BA, AB, BS)  Occupational History  . Not on file  Tobacco Use  . Smoking status: Never Smoker  . Smokeless tobacco: Never Used  Substance and Sexual Activity  . Alcohol use:  Yes    Alcohol/week: 2.0 - 4.0 standard drinks    Types: 1 - 2 Glasses of wine, 1 - 2 Cans of beer per week    Comment: a few drinks per week  . Drug use: Not Currently    Types: Marijuana  . Sexual activity: Yes    Partners: Female  Other Topics Concern  . Not on file  Social History Narrative  . Not on file   Social Determinants of Health   Financial Resource Strain:   . Difficulty of Paying Living Expenses:   Food Insecurity:   . Worried About Charity fundraiser in the Last Year:   . Arboriculturist in the Last Year:   Transportation Needs:    . Film/video editor (Medical):   Marland Kitchen Lack of Transportation (Non-Medical):   Physical Activity:   . Days of Exercise per Week:   . Minutes of Exercise per Session:   Stress:   . Feeling of Stress :   Social Connections:   . Frequency of Communication with Friends and Family:   . Frequency of Social Gatherings with Friends and Family:   . Attends Religious Services:   . Active Member of Clubs or Organizations:   . Attends Archivist Meetings:   Marland Kitchen Marital Status:     Additional Social History: grew up with parents. Fine . No trauma. Married for 9 years   Allergies:   Allergies  Allergen Reactions  . Iodinated Diagnostic Agents Hives  . Iodine Hives    Metabolic Disorder Labs: Lab Results  Component Value Date   HGBA1C 5.1 08/09/2018   No results found for: PROLACTIN Lab Results  Component Value Date   CHOL 196 08/09/2018   TRIG 197.0 (H) 08/09/2018   HDL 42.80 08/09/2018   CHOLHDL 5 08/09/2018   VLDL 39.4 08/09/2018   LDLCALC 114 (H) 08/09/2018     Current Medications: Current Outpatient Medications  Medication Sig Dispense Refill  . buPROPion (WELLBUTRIN XL) 300 MG 24 hr tablet TAKE 1 TABLET BY MOUTH EVERY DAY 90 tablet 0  . CANNABIDIOL PO Take 15 mg by mouth 1 day or 1 dose.    . cetirizine (ZYRTEC) 10 MG tablet TAKE 1 TABLET BY MOUTH EVERY DAY 90 tablet 3  . fluticasone (FLONASE) 50 MCG/ACT nasal spray PLACE 1 SPRAY INTO BOTH NOSTRILS DAILY. 16 mL 5  . hydrOXYzine (ATARAX/VISTARIL) 25 MG tablet Take 25 mg by mouth as needed.    Marland Kitchen losartan (COZAAR) 100 MG tablet TAKE 1 TABLET BY MOUTH EVERY DAY 90 tablet 0  . pantoprazole (PROTONIX) 40 MG tablet TAKE 1 TABLET BY MOUTH EVERY DAY 90 tablet 0  . sertraline (ZOLOFT) 50 MG tablet TAKE 1 TABLET BY MOUTH EVERY DAY 90 tablet 0  . sucralfate (CARAFATE) 1 g tablet Slowly dissolve 1 tablet in 1 tablespoon of distilled water. Do not crush. Drink "slurry" of dissolved tablet every 8 hours as needed 60 tablet 1   . traZODone (DESYREL) 50 MG tablet TAKE 1 TABLET BY MOUTH EVERYDAY AT BEDTIME 30 tablet 0  . VYVANSE 50 MG capsule TAKE 1 CAPSULE BY MOUTH EVERY DAY IN THE MORNING 30 capsule 0   Current Facility-Administered Medications  Medication Dose Route Frequency Provider Last Rate Last Admin  . 0.9 %  sodium chloride infusion  500 mL Intravenous Once Armbruster, Carlota Raspberry, MD          Psychiatric Specialty Exam: Review of Systems  Cardiovascular: Negative for chest  pain and palpitations.  Skin: Negative for rash.  Psychiatric/Behavioral: Negative for depression.    There were no vitals taken for this visit.There is no height or weight on file to calculate BMI.  General Appearance:  Eye Contact:   Speech:  Slow  Volume:  Normal  Mood: fair  Affect:  Congruent  Thought Process:  Goal Directed  Orientation:  Full (Time, Place, and Person)  Thought Content:  Rumination  Suicidal Thoughts:  No  Homicidal Thoughts:  No  Memory:  Immediate;   Fair Recent;   Fair  Judgement:  Good  Insight:  Good  Psychomotor Activity:  Normal  Concentration:  Concentration: Fair and Attention Span: Fair  Recall:  [employer] of Knowledge:Fair  Language: Fair  Akathisia:  Negative  Handed:  Right  AIMS (if indicated):    Assets:  Desire for Improvement  ADL's:  Intact  Cognition: WNL  Sleep:  Fair on meds    Treatment Plan Summary: Medication management and Plan as follows  1. Major depressive disorder, in remission. Continue zoloft and wellbutrin 2. NGI:TJLL improved on zoloft. Continue 3. Insomnia: takes trazadone, will continue   4. ADHD; diagnosed by psychological testing in college.stable on vyvanse, can continue, refill sent Fu 4-32m.Merian Capron, MD 6/7/20212:12 PM

## 2020-05-11 ENCOUNTER — Other Ambulatory Visit (HOSPITAL_COMMUNITY): Payer: Self-pay | Admitting: Psychiatry

## 2020-05-11 ENCOUNTER — Other Ambulatory Visit: Payer: Self-pay | Admitting: Family Medicine

## 2020-05-26 ENCOUNTER — Other Ambulatory Visit: Payer: Self-pay

## 2020-05-26 ENCOUNTER — Ambulatory Visit (HOSPITAL_COMMUNITY)
Admission: EM | Admit: 2020-05-26 | Discharge: 2020-05-26 | Disposition: A | Discharge status: Home / Self Care | Payer: BC Managed Care – PPO | Attending: Psychiatry | Admitting: Psychiatry

## 2020-05-26 DIAGNOSIS — F411 Generalized anxiety disorder: Secondary | ICD-10-CM | POA: Diagnosis present

## 2020-05-26 MED ORDER — HYDROXYZINE PAMOATE 25 MG PO CAPS: 25.0000 mg | ORAL_CAPSULE | Freq: Three times a day (TID) | ORAL | 0 refills | Status: AC | PRN

## 2020-05-26 NOTE — ED Notes (Signed)
D/C pt per self to home. Stable at time of d/c. No c/o of pain. Reviewed with pt verbal understanding of avs. Belongings returned to pt from locker #31. Escorted to front door with no issues noted.

## 2020-05-26 NOTE — BH Assessment (Signed)
Comprehensive Clinical Assessment (CCA) Screening, Triage and Referral Note  05/26/2020 James Sloan [id number]   Patient is a 39 y.o. male with a history of Generalized Anxiety Disorder and Depressive Disorder who presents voluntarily reporting worsening anxiety, with a panic attack last night.  He sees Dr. De Nurse with [facility] in [place]. He just had an appt two weeks ago and at that time symptoms seemed manageable.  He has noticed worsening symptoms over the past week, culminating in a panic episode last night.  He reports one of the stressors this week has been trying to get his two sons, ages 25 and 26 to sleep in their own beds.  Patient believes the bigger picture concern is his work/life balance being off since returning to the office in Feb 2021.  He has had difficulty finding time to decompress.  Being back in the office and coming home to immediate "dad mode" has been challenging.  Patient has been in and out of therapy for the past 14 years.  He typically can utilize coping strategies, however they weren't helpful last night.  Patient states he just started seeing a new therapist with [facility] yesterday.  He feels they have a good connection and he starts sessions on Monday.  He realized this morning that he did not take his trazadone last night and wonders if this may have contributed to the panic episode.  Patient was hoping to see a provider here today to discuss medication options, especially as he took his last klonopin tablet (which he rarely takes) during the episode last night.  He is feeling "a panic attack right beneath the surface" and he is hoping to have an option for getting through the weekend should he have another episode. He plans to follow up with his provider on Monday.  Patient denies substance use history.  He denies SI, HI and AVH and has no history of safety concerns.      Per Marvia Pickles, NP patient is psychiatrically cleared.  He will be provided with a short    Visit Diagnosis: Generalized Anxiety Disorder, Depressive Disorder Unspecified  Patient Reported Information How did you hear about [place]? Self   Referral name: No data recorded  Referral phone number: No data recorded Whom do you see for routine medical problems? I don't have a doctor   Practice/Facility Name: No data recorded  Practice/Facility Phone Number: No data recorded  Name of Contact: No data recorded  Contact Number: No data recorded  Contact Fax Number: No data recorded  Prescriber Name: No data recorded  Prescriber Address (if known): No data recorded What Is the Reason for Your Visit/Call Today? No data recorded How Long Has This Been Causing You Problems? 1 wk - 1 month  Have You Recently Been in Any Inpatient Treatment (Hospital/Detox/Crisis Center/28-Day Program)? No   Name/Location of Program/Hospital:No data recorded  How Long Were You There? No data recorded  When Were You Discharged? No data recorded Have You Ever Received Services From [facility] Before? Yes   Who Do You See at [facility]? Currently seeing Dr. De Nurse with [facility] in [place] Recently Had Any Thoughts About [place]? No   Are You Planning to Commit Suicide/Harm Yourself At This time?  No  Have you Recently Had Thoughts About [place]? No   Explanation: No data recorded Have You Used Any Alcohol or Drugs in the Past 24 Hours? No   How Long Ago Did You Use  Drugs or Alcohol?  No data recorded  What Did You Use and How Much? No data recorded What Do You Feel Would Help You the Most Today? Medication  Do You Currently Have a Therapist/Psychiatrist? Yes   Name of Therapist/Psychiatrist: Sees Dr. De Nurse for med mgmt and just started seeing a therapist with [facility] yesterday   Have You Been Recently Discharged From Any Office Practice or Programs? No   Explanation of Discharge From Practice/Program:  No data recorded    CCA Screening Triage  Referral Assessment Type of Contact: Face-to-Face   Is this Initial or Reassessment? No data recorded  Date Telepsych consult ordered in [facility]:  No data recorded  Time Telepsych consult ordered in [facility]:  No data recorded Patient Reported Information Reviewed? Yes   Patient Left Without Being Seen? No data recorded  Reason for Not Completing Assessment: No data recorded Collateral Involvement: N/A  Does Patient Have a Court Appointed Legal Guardian? No data recorded  Name and Contact of Legal Guardian:  No data recorded If Minor and Not Living with Parent(s), Who has Custody? No data recorded Is CPS involved or ever been involved? Never  Is APS involved or ever been involved? Never  Patient Determined To Be At Risk for Harm To Self or Others Based on Review of Patient Reported Information or Presenting Complaint? No   Method: No data recorded  Availability of Means: No data recorded  Intent: No data recorded  Notification Required: No data recorded  Additional Information for Danger to Others Potential:  No data recorded  Additional Comments for Danger to Others Potential:  No data recorded  Are There Guns or Other Weapons in Your Home?  No data recorded   Types of Guns/Weapons: No data recorded   Are These Weapons Safely Secured?                              No data recorded   Who Could Verify You Are Able To Have These Secured:    No data recorded Do You Have any Outstanding Charges, Pending Court Dates, Parole/Probation? No data recorded Contacted To Inform of Risk of Harm To Self or Others: No data recorded Location of Assessment: [facility] Assessment Services  Does Patient Present under Involuntary Commitment? No   IVC Papers Initial File Date: No data recorded  [place] of Residence: No data recorded Patient Currently Receiving the Following Services: Medication Management;Individual Therapy   Determination of Need: Routine (7 days)   Options For Referral: Other: Comment  (Short term med rx provided until he can f/u with provider Monday)   Fransico [place]

## 2020-05-26 NOTE — Discharge Instructions (Addendum)
Continue current home medications Keep schduled appointments with therapy and psychiatry Start Hydroxyzine 25 mg by mouth 3 times a day as needed for anxiety

## 2020-05-26 NOTE — ED Notes (Signed)
Pt belongings in locker 31 °

## 2020-05-26 NOTE — ED Provider Notes (Signed)
Behavioral Health Medical Screening Exam  James Sloan is a 39 y.o. male.  Patient reports that he came here today because he is having worsening anxiety.  He stated that he had a panic attack last night.  He denies any suicidal or homicidal ideations and denies any hallucinations.  He reports that he sees Dr. De Nurse and based off the chart review he does have regular appointments and is prescribed Zoloft 50 mg p.o. daily, Wellbutrin XL 300 mg p.o. daily, Vyvanse 50 mg p.o. daily, and trazodone 50 mg p.o. nightly. Based on chart review patient has been on these medications for several years and is maintained his depression and anxiety and assisted with his concentration for work.  The patient stated that he did not forget to take his trazodone last night and wonders if that had an effect on his anxiety but still continues to complain of increasing anxiety with simple triggers such as his children climbing into bed with him.  He reports that he has been prescribed Klonopin and hydroxyzine in the past but states that he would not want to be prescribed Klonopin anymore.  He is in agreement with hydroxyzine.  Prescribed patient hydroxyzine 25 mg p.o. 3 times daily as needed for anxiety.  Patient reports that he has a therapy appointment on Monday and that he has a continued appointment with Dr. De Nurse and will contact his office on Monday to inform them of coming here and getting a new prescription for hydroxyzine.  Total Time spent with patient: 30 minutes  Psychiatric Specialty Exam  Presentation  General Appearance:Appropriate for Environment;Casual  Eye Contact:Good  Speech:Clear and Coherent;Normal Rate  Speech Volume:Normal  Handedness:Right   Mood and Affect  Mood:Anxious  Affect:Appropriate;Congruent   Thought Process  Thought Processes:Coherent  Descriptions of Associations:Intact  Orientation:Full (Time, Place and Person)  Thought  Content:Logical  Hallucinations:None  Ideas of Reference:None  Suicidal Thoughts:No  Homicidal Thoughts:No   Sensorium  Memory:Immediate Good;Recent Good;Remote Good  Judgment:Good  Insight:Good   Executive Functions  Concentration:Good  Attention Span:Good  [place]  Language:Good   Psychomotor Activity  Psychomotor Activity:Normal   Assets  Assets:Communication Skills;Desire for Improvement;Financial Resources/Insurance;Housing;Leisure Time;Physical Health;Social Support;Talents/Skills;Transportation   Sleep  Sleep:Fair  Number of hours: No data recorded  Physical Exam: Physical Exam Vitals and nursing note reviewed.  Constitutional:      Appearance: He is well-developed.  Cardiovascular:     Rate and Rhythm: Normal rate.  Pulmonary:     Effort: Pulmonary effort is normal.  Musculoskeletal:        General: Normal range of motion.  Skin:    General: Skin is warm.  Neurological:     Mental Status: He is alert and oriented to person, place, and time.  Psychiatric:        Mood and Affect: Mood is anxious.    Review of Systems  Constitutional: Negative.   HENT: Negative.   Eyes: Negative.   Respiratory: Negative.   Cardiovascular: Negative.   Gastrointestinal: Negative.   Genitourinary: Negative.   Musculoskeletal: Negative.   Skin: Negative.   Neurological: Negative.   Endo/Heme/Allergies: Negative.   Psychiatric/Behavioral: Negative for hallucinations and suicidal ideas. The patient is nervous/anxious.    Blood pressure (!) 140/102, pulse 87, temperature 97.7 F (36.5 C), temperature source Temporal, resp. rate 16, height 5' 10.47" (1.79 m), weight 189 lb 8 oz (86 kg), SpO2 100 %. Body mass index is 26.83 kg/m.  Musculoskeletal: Strength & Muscle Tone: within normal limits  Gait & Station: normal Patient leans: N/A   Recommendations:  Based on my evaluation the patient does not appear to have an  emergency medical condition.  [place], FNP 05/26/2020, 9:06 AM

## 2020-05-28 ENCOUNTER — Other Ambulatory Visit (HOSPITAL_COMMUNITY): Payer: Self-pay | Admitting: Psychiatry

## 2020-05-31 ENCOUNTER — Telehealth (INDEPENDENT_AMBULATORY_CARE_PROVIDER_SITE_OTHER): Payer: BC Managed Care – PPO | Admitting: Psychiatry

## 2020-05-31 ENCOUNTER — Encounter (HOSPITAL_COMMUNITY): Payer: Self-pay | Admitting: Psychiatry

## 2020-05-31 DIAGNOSIS — F331 Major depressive disorder, recurrent, moderate: Secondary | ICD-10-CM | POA: Diagnosis not present

## 2020-05-31 DIAGNOSIS — F411 Generalized anxiety disorder: Secondary | ICD-10-CM | POA: Diagnosis not present

## 2020-05-31 DIAGNOSIS — F900 Attention-deficit hyperactivity disorder, predominantly inattentive type: Secondary | ICD-10-CM | POA: Diagnosis not present

## 2020-05-31 MED ORDER — LISDEXAMFETAMINE DIMESYLATE 40 MG PO CAPS
40.0000 mg | ORAL_CAPSULE | ORAL | 0 refills | Status: DC
Start: 2020-05-31 — End: 2020-06-06

## 2020-05-31 NOTE — Progress Notes (Signed)
[facility] Outpatient Follow up visit   Patient Identification: James Sloan MRN:  [id number] Date of Evaluation:  05/31/2020 Referral Source: self referral Chief Complaint:   depression follow up Visit Diagnosis:    ICD-10-CM   1. Major depressive disorder, recurrent episode, moderate (HCC)  F33.1   2. GAD (generalized anxiety disorder)  F41.1   3. Attention deficit hyperactivity disorder (ADHD), predominantly inattentive type  F90.0    Virtual Visit via Telephone Note   I connected with James Sloan on 05/31/20 at  1:00 PM EDT by a video enabled telemedicine application and verified that I am speaking with the correct person using two identifiers. I discussed the limitations, risks, security and privacy concerns of performing an evaluation and management service by telephone and the availability of in person appointments. I also discussed with the patient that there may be a patient responsible charge related to this service. The patient expressed understanding and agreed to proceed.   I discussed the assessment and treatment plan with the patient. The patient was provided an opportunity to ask questions and all were answered. The patient agreed with the plan and demonstrated an understanding of the instructions.   The patient was advised to call back or seek an in-person evaluation if the symptoms worsen or if the condition fails to improve as anticipated.  Patient location: car, parked Provider location: home History of Present Illness:  39 years old white married male. Relocated from [place].  Feeling edgy, gets short with family, wants to cut down vyvanse and later add something for anxiety discussed buspar   Duration more than 10 years plus Modifying factor:wife and kids Activity factors as of now no job.relocation  Severity of depression is fair but feels edgy,  No side effects reported   Substance Abuse History in the last 12 months:  No.  Consequences of  Substance Abuse: NA  Past Medical History:  Past Medical History:  Diagnosis Date  . Anxiety   . Depression   . GERD (gastroesophageal reflux disease)   . Hay fever   . Hypertension   . Lumbar herniated disc     Past Surgical History:  Procedure Laterality Date  . APPENDECTOMY    . KNEE SURGERY Left   . SHOULDER ARTHROCENTESIS Left   . WRIST SURGERY Left 01/11/2019   [facility]     Family Psychiatric History: denies   Family History:  Family History  Problem Relation Age of Onset  . Hyperlipidemia Father   . Cancer Maternal Grandmother   . ADD / ADHD Maternal Grandmother   . Cancer Maternal Grandfather   . ADD / ADHD Maternal Grandfather   . Hyperlipidemia Paternal Grandmother   . Stroke Paternal Grandfather   . Hyperlipidemia Paternal Grandfather   . Colon polyps Paternal Grandfather        Numerous  . Colon cancer Neg Hx   . Esophageal cancer Neg Hx   . Stomach cancer Neg Hx   . Rectal cancer Neg Hx     Social History:   Social History   Socioeconomic History  . Marital status: Married    Spouse name: wittiney  . Number of children: 2  . Years of education: Not on file  . Highest education level: Bachelor's degree (e.g., BA, AB, BS)  Occupational History  . Not on file  Tobacco Use  . Smoking status: Never Smoker  . Smokeless tobacco: Never Used  Vaping Use  . Vaping Use: Never used  Substance and Sexual Activity  .  Alcohol use: Yes    Alcohol/week: 2.0 - 4.0 standard drinks    Types: 1 - 2 Glasses of wine, 1 - 2 Cans of beer per week    Comment: a few drinks per week  . Drug use: Not Currently    Types: Marijuana  . Sexual activity: Yes    Partners: Female  Other Topics Concern  . Not on file  Social History Narrative  . Not on file   Social Determinants of Health   Financial Resource Strain:   . Difficulty of Paying Living Expenses:   Food Insecurity:   . Worried About Charity fundraiser in the Last Year:   . Arboriculturist in the  Last Year:   Transportation Needs:   . Film/video editor (Medical):   Marland Kitchen Lack of Transportation (Non-Medical):   Physical Activity:   . Days of Exercise per Week:   . Minutes of Exercise per Session:   Stress:   . Feeling of Stress :   Social Connections:   . Frequency of Communication with Friends and Family:   . Frequency of Social Gatherings with Friends and Family:   . Attends Religious Services:   . Active Member of Clubs or Organizations:   . Attends Archivist Meetings:   Marland Kitchen Marital Status:     Additional Social History: grew up with parents. Fine . No trauma. Married for 9 years   Allergies:   Allergies  Allergen Reactions  . Iodinated Diagnostic Agents Hives  . Iodine Hives    Metabolic Disorder Labs: Lab Results  Component Value Date   HGBA1C 5.1 08/09/2018   No results found for: PROLACTIN Lab Results  Component Value Date   CHOL 196 08/09/2018   TRIG 197.0 (H) 08/09/2018   HDL 42.80 08/09/2018   CHOLHDL 5 08/09/2018   VLDL 39.4 08/09/2018   LDLCALC 114 (H) 08/09/2018     Current Medications: Current Outpatient Medications  Medication Sig Dispense Refill  . buPROPion (WELLBUTRIN XL) 300 MG 24 hr tablet TAKE 1 TABLET BY MOUTH EVERY DAY 90 tablet 0  . CANNABIDIOL PO Take 15 mg by mouth 1 day or 1 dose.    . cetirizine (ZYRTEC) 10 MG tablet TAKE 1 TABLET BY MOUTH EVERY DAY 90 tablet 3  . fluticasone (FLONASE) 50 MCG/ACT nasal spray PLACE 1 SPRAY INTO BOTH NOSTRILS DAILY. 16 mL 5  . hydrOXYzine (ATARAX/VISTARIL) 25 MG tablet Take 25 mg by mouth as needed.    . hydrOXYzine (VISTARIL) 25 MG capsule Take 1 capsule (25 mg total) by mouth 3 (three) times daily as needed. 30 capsule 0  . lisdexamfetamine (VYVANSE) 40 MG capsule Take 1 capsule (40 mg total) by mouth every morning. 30 capsule 0  . losartan (COZAAR) 100 MG tablet TAKE 1 TABLET BY MOUTH EVERY DAY 90 tablet 0  . pantoprazole (PROTONIX) 40 MG tablet TAKE 1 TABLET BY MOUTH EVERY DAY 90  tablet 0  . sertraline (ZOLOFT) 50 MG tablet TAKE 1 TABLET BY MOUTH EVERY DAY 90 tablet 0  . sucralfate (CARAFATE) 1 g tablet Slowly dissolve 1 tablet in 1 tablespoon of distilled water. Do not crush. Drink "slurry" of dissolved tablet every 8 hours as needed 60 tablet 1  . traZODone (DESYREL) 50 MG tablet TAKE 1 TABLET BY MOUTH EVERYDAY AT BEDTIME 90 tablet 0   Current Facility-Administered Medications  Medication Dose Route Frequency Provider Last Rate Last Admin  . 0.9 %  sodium chloride infusion  500  mL Intravenous Once Armbruster, Carlota Raspberry, MD          Psychiatric Specialty Exam: Review of Systems  Cardiovascular: Negative for chest pain and palpitations.  Skin: Negative for rash.  Psychiatric/Behavioral: Negative for depression.    There were no vitals taken for this visit.There is no height or weight on file to calculate BMI.  General Appearance:  Eye Contact:   Speech:  Slow  Volume:  Normal  Mood: fair  Affect:  Congruent  Thought Process:  Goal Directed  Orientation:  Full (Time, Place, and Person)  Thought Content:  Rumination  Suicidal Thoughts:  No  Homicidal Thoughts:  No  Memory:  Immediate;   Fair Recent;   Fair  Judgement:  Good  Insight:  Good  Psychomotor Activity:  Normal  Concentration:  Concentration: Fair and Attention Span: Fair  Recall:  [employer] of Knowledge:Fair  Language: Fair  Akathisia:  Negative  Handed:  Right  AIMS (if indicated):    Assets:  Desire for Improvement  ADL's:  Intact  Cognition: WNL  Sleep:  Fair on meds    Treatment Plan Summary: Medication management and Plan as follows  1. Major depressive disorder, in remission. Fair but edgy, continue zoloft and wellbutrin, if needed would cut down wellbutrin later 2. SEL:TRVUY edgy, continue zoloft while we work down on vyvanse 3. Insomnia: takes trazadone, will continue   4. ADHD; diagnosed by psychological testing in college.doing fair but feeling edgy, lower vyvanse to  40mg  Call back in 2 weeks if wants to lower further and evaluate in 4weeks Non face to face time spent 40min Fu 4-6w.Merian Capron, MD 7/1/20211:07 PM

## 2020-06-01 ENCOUNTER — Other Ambulatory Visit: Payer: Self-pay | Admitting: Family Medicine

## 2020-06-01 NOTE — Telephone Encounter (Signed)
Pt needs appointment for further refills 

## 2020-06-06 ENCOUNTER — Telehealth (HOSPITAL_COMMUNITY): Payer: Self-pay

## 2020-06-06 MED ORDER — LISDEXAMFETAMINE DIMESYLATE 40 MG PO CAPS
40.0000 mg | ORAL_CAPSULE | ORAL | 0 refills | Status: DC
Start: 2020-06-06 — End: 2020-07-06

## 2020-06-06 NOTE — Telephone Encounter (Signed)
sent 

## 2020-06-06 NOTE — Telephone Encounter (Signed)
Patient states that he was not able to get Vyvanse refilled before he left town. He states he will be gone for a month and would like to know if you can [facility] [place], [place]. The pharmacy has been added to the chart.

## 2020-07-06 ENCOUNTER — Encounter (HOSPITAL_COMMUNITY): Payer: Self-pay | Admitting: Psychiatry

## 2020-07-06 ENCOUNTER — Telehealth (INDEPENDENT_AMBULATORY_CARE_PROVIDER_SITE_OTHER): Payer: BC Managed Care – PPO | Admitting: Psychiatry

## 2020-07-06 DIAGNOSIS — F411 Generalized anxiety disorder: Secondary | ICD-10-CM

## 2020-07-06 DIAGNOSIS — F900 Attention-deficit hyperactivity disorder, predominantly inattentive type: Secondary | ICD-10-CM

## 2020-07-06 DIAGNOSIS — F331 Major depressive disorder, recurrent, moderate: Secondary | ICD-10-CM | POA: Diagnosis not present

## 2020-07-06 MED ORDER — LISDEXAMFETAMINE DIMESYLATE 50 MG PO CAPS
50.0000 mg | ORAL_CAPSULE | ORAL | 0 refills | Status: DC
Start: 2020-07-06 — End: 2020-08-10

## 2020-07-06 NOTE — Progress Notes (Signed)
[facility] Outpatient Follow up visit   Patient Identification: James Sloan MRN:  [id number] Date of Evaluation:  07/06/2020 Referral Source: self referral Chief Complaint:   depression follow up Visit Diagnosis:    ICD-10-CM   1. Major depressive disorder, recurrent episode, moderate (HCC)  F33.1   2. GAD (generalized anxiety disorder)  F41.1   3. Attention deficit hyperactivity disorder (ADHD), predominantly inattentive type  F90.0    Virtual Visit via Telephone Note    I connected with James Sloan on 07/06/20 at  9:30 AM EDT by a video enabled telemedicine application and verified that I am speaking with the correct person using two identifiers.  I discussed the limitations, risks, security and privacy concerns of performing an evaluation and management service by telephone and the availability of in person appointments. I also discussed with the patient that there may be a patient responsible charge related to this service. The patient expressed understanding and agreed to proceed.   I discussed the assessment and treatment plan with the patient. The patient was provided an opportunity to ask questions and all were answered. The patient agreed with the plan and demonstrated an understanding of the instructions.   The patient was advised to call back or seek an in-person evaluation if the symptoms worsen or if the condition fails to improve as anticipated.  Patient location: home Provider location: home History of Present Illness:  39 years old white married male. Relocated from [place].  Was feeling edgy last visit wanted to lower vyvanse to 40mg .  It didn't go too well and felt distracted and wants to go back to 50mg  Working in therapy to deal with stress and has improved   Duration more than 10 years plus Modifying factor:wife and kids Activity factors as of now no job.relocation  Severity of depression is fair but feels edgy,  No side effects  reported   Substance Abuse History in the last 12 months:  No.  Consequences of Substance Abuse: NA  Past Medical History:  Past Medical History:  Diagnosis Date  . Anxiety   . Depression   . GERD (gastroesophageal reflux disease)   . Hay fever   . Hypertension   . Lumbar herniated disc     Past Surgical History:  Procedure Laterality Date  . APPENDECTOMY    . KNEE SURGERY Left   . SHOULDER ARTHROCENTESIS Left   . WRIST SURGERY Left 01/11/2019   [facility]     Family Psychiatric History: denies   Family History:  Family History  Problem Relation Age of Onset  . Hyperlipidemia Father   . Cancer Maternal Grandmother   . ADD / ADHD Maternal Grandmother   . Cancer Maternal Grandfather   . ADD / ADHD Maternal Grandfather   . Hyperlipidemia Paternal Grandmother   . Stroke Paternal Grandfather   . Hyperlipidemia Paternal Grandfather   . Colon polyps Paternal Grandfather        Numerous  . Colon cancer Neg Hx   . Esophageal cancer Neg Hx   . Stomach cancer Neg Hx   . Rectal cancer Neg Hx     Social History:   Social History   Socioeconomic History  . Marital status: Married    Spouse name: wittiney  . Number of children: 2  . Years of education: Not on file  . Highest education level: Bachelor's degree (e.g., BA, AB, BS)  Occupational History  . Not on file  Tobacco Use  . Smoking status: Never Smoker  .  Smokeless tobacco: Never Used  Vaping Use  . Vaping Use: Never used  Substance and Sexual Activity  . Alcohol use: Yes    Alcohol/week: 2.0 - 4.0 standard drinks    Types: 1 - 2 Glasses of wine, 1 - 2 Cans of beer per week    Comment: a few drinks per week  . Drug use: Not Currently    Types: Marijuana  . Sexual activity: Yes    Partners: Female  Other Topics Concern  . Not on file  Social History Narrative  . Not on file   Social Determinants of Health   Financial Resource Strain:   . Difficulty of Paying Living Expenses:   Food Insecurity:    . Worried About Charity fundraiser in the Last Year:   . Arboriculturist in the Last Year:   Transportation Needs:   . Film/video editor (Medical):   Marland Kitchen Lack of Transportation (Non-Medical):   Physical Activity:   . Days of Exercise per Week:   . Minutes of Exercise per Session:   Stress:   . Feeling of Stress :   Social Connections:   . Frequency of Communication with Friends and Family:   . Frequency of Social Gatherings with Friends and Family:   . Attends Religious Services:   . Active Member of Clubs or Organizations:   . Attends Archivist Meetings:   Marland Kitchen Marital Status:     Additional Social History: grew up with parents. Fine . No trauma. Married for 9 years   Allergies:   Allergies  Allergen Reactions  . Iodinated Diagnostic Agents Hives  . Iodine Hives    Metabolic Disorder Labs: Lab Results  Component Value Date   HGBA1C 5.1 08/09/2018   No results found for: PROLACTIN Lab Results  Component Value Date   CHOL 196 08/09/2018   TRIG 197.0 (H) 08/09/2018   HDL 42.80 08/09/2018   CHOLHDL 5 08/09/2018   VLDL 39.4 08/09/2018   LDLCALC 114 (H) 08/09/2018     Current Medications: Current Outpatient Medications  Medication Sig Dispense Refill  . losartan (COZAAR) 100 MG tablet TAKE 1 TABLET BY MOUTH EVERY DAY 90 tablet 0  . buPROPion (WELLBUTRIN XL) 300 MG 24 hr tablet TAKE 1 TABLET BY MOUTH EVERY DAY 90 tablet 0  . CANNABIDIOL PO Take 15 mg by mouth 1 day or 1 dose.    . cetirizine (ZYRTEC) 10 MG tablet TAKE 1 TABLET BY MOUTH EVERY DAY 90 tablet 3  . fluticasone (FLONASE) 50 MCG/ACT nasal spray PLACE 1 SPRAY INTO BOTH NOSTRILS DAILY. 16 mL 5  . hydrOXYzine (VISTARIL) 25 MG capsule Take 1 capsule (25 mg total) by mouth 3 (three) times daily as needed. 30 capsule 0  . lisdexamfetamine (VYVANSE) 50 MG capsule Take 1 capsule (50 mg total) by mouth every morning. 30 capsule 0  . pantoprazole (PROTONIX) 40 MG tablet TAKE 1 TABLET BY MOUTH EVERY DAY  90 tablet 0  . sertraline (ZOLOFT) 50 MG tablet TAKE 1 TABLET BY MOUTH EVERY DAY 90 tablet 0  . sucralfate (CARAFATE) 1 g tablet Slowly dissolve 1 tablet in 1 tablespoon of distilled water. Do not crush. Drink "slurry" of dissolved tablet every 8 hours as needed 60 tablet 1  . traZODone (DESYREL) 50 MG tablet TAKE 1 TABLET BY MOUTH EVERYDAY AT BEDTIME 90 tablet 0   Current Facility-Administered Medications  Medication Dose Route Frequency Provider Last Rate Last Admin  . 0.9 %  sodium  chloride infusion  500 mL Intravenous Once Armbruster, Carlota Raspberry, MD          Psychiatric Specialty Exam: Review of Systems  Cardiovascular: Negative for chest pain and palpitations.  Skin: Negative for rash.  Psychiatric/Behavioral: Negative for depression.    There were no vitals taken for this visit.There is no height or weight on file to calculate BMI.  General Appearance:  Eye Contact:   Speech:  Slow  Volume:  Normal  Mood: fair  Affect:  Congruent  Thought Process:  Goal Directed  Orientation:  Full (Time, Place, and Person)  Thought Content:  Rumination  Suicidal Thoughts:  No  Homicidal Thoughts:  No  Memory:  Immediate;   Fair Recent;   Fair  Judgement:  Good  Insight:  Good  Psychomotor Activity:  Normal  Concentration:  Concentration: Fair and Attention Span: Fair  Recall:  [employer] of Knowledge:Fair  Language: Fair  Akathisia:  Negative  Handed:  Right  AIMS (if indicated):    Assets:  Desire for Improvement  ADL's:  Intact  Cognition: WNL  Sleep:  Fair on meds    Treatment Plan Summary: Medication management and Plan as follows  1. Major depressive disorder, in remission.  Doing fair, continue wellbutrin, zoloft 2. [id number], continue zoloft and therapy,  3. Insomnia: takes trazadone, will continue   4. ADHD; diagnosed by psychological testing in college. Some distraction at 40mg , wants to go back to 50mg  will change, work on drug holidays  no headaches  Non  face to face time spent 39min or less Fu 2-48m. Merian Capron, MD 8/6/20219:37 AM

## 2020-08-03 ENCOUNTER — Other Ambulatory Visit: Payer: Self-pay | Admitting: Family Medicine

## 2020-08-03 ENCOUNTER — Other Ambulatory Visit (HOSPITAL_COMMUNITY): Payer: Self-pay | Admitting: Psychiatry

## 2020-08-10 ENCOUNTER — Telehealth (HOSPITAL_COMMUNITY): Payer: Self-pay

## 2020-08-10 MED ORDER — LISDEXAMFETAMINE DIMESYLATE 50 MG PO CAPS: 50.0000 mg | ORAL_CAPSULE | ORAL | 0 refills | Status: DC

## 2020-08-10 NOTE — Telephone Encounter (Signed)
Patient needs a refill on [facility]van[facility] to [facility] on [address] in [place]

## 2020-08-10 NOTE — Telephone Encounter (Signed)
sent 

## 2020-08-21 ENCOUNTER — Other Ambulatory Visit (HOSPITAL_COMMUNITY): Payer: Self-pay | Admitting: Psychiatry

## 2020-09-10 ENCOUNTER — Telehealth (HOSPITAL_COMMUNITY): Payer: Self-pay | Admitting: Psychiatry

## 2020-09-10 MED ORDER — LISDEXAMFETAMINE DIMESYLATE 50 MG PO CAPS: 50.0000 mg | ORAL_CAPSULE | ORAL | 0 refills | Status: DC

## 2020-09-10 NOTE — Telephone Encounter (Signed)
sent 

## 2020-09-10 NOTE — Telephone Encounter (Signed)
Pt needs refill on vyvanse [facility]

## 2020-09-11 ENCOUNTER — Encounter: Payer: Self-pay | Admitting: Family Medicine

## 2020-09-14 ENCOUNTER — Telehealth (HOSPITAL_COMMUNITY): Payer: Self-pay | Admitting: Psychiatry

## 2020-09-14 MED ORDER — TRAZODONE HCL 50 MG PO TABS: ORAL_TABLET | ORAL | 0 refills | Status: AC

## 2020-09-14 NOTE — Telephone Encounter (Signed)
sent 

## 2020-09-14 NOTE — Telephone Encounter (Signed)
Pt needs refill on trazadone [facility]

## 2020-09-19 ENCOUNTER — Other Ambulatory Visit: Payer: Self-pay | Admitting: Family Medicine

## 2020-09-19 MED ORDER — LOSARTAN POTASSIUM 100 MG PO TABS: 100.0000 mg | ORAL_TABLET | Freq: Every day | ORAL | 0 refills | Status: AC

## 2020-10-09 ENCOUNTER — Telehealth (INDEPENDENT_AMBULATORY_CARE_PROVIDER_SITE_OTHER): Payer: BC Managed Care – PPO | Admitting: Psychiatry

## 2020-10-09 ENCOUNTER — Encounter (HOSPITAL_COMMUNITY): Payer: Self-pay | Admitting: Psychiatry

## 2020-10-09 DIAGNOSIS — F331 Major depressive disorder, recurrent, moderate: Secondary | ICD-10-CM

## 2020-10-09 DIAGNOSIS — F411 Generalized anxiety disorder: Secondary | ICD-10-CM | POA: Diagnosis not present

## 2020-10-09 DIAGNOSIS — F900 Attention-deficit hyperactivity disorder, predominantly inattentive type: Secondary | ICD-10-CM | POA: Diagnosis not present

## 2020-10-09 MED ORDER — BUPROPION HCL ER (XL) 300 MG PO TB24: 300.0000 mg | ORAL_TABLET | Freq: Every day | ORAL | 0 refills | Status: AC

## 2020-10-09 MED ORDER — SERTRALINE HCL 50 MG PO TABS: 50.0000 mg | ORAL_TABLET | Freq: Every day | ORAL | 0 refills | Status: AC

## 2020-10-09 NOTE — Progress Notes (Signed)
[facility] Outpatient Follow up visit   Patient Identification: James Sloan MRN:  [id number] Date of Evaluation:  10/09/2020 Referral Source: self referral Chief Complaint:   depression follow up Visit Diagnosis:    ICD-10-CM   1. Major depressive disorder, recurrent episode, moderate (HCC)  F33.1   2. GAD (generalized anxiety disorder)  F41.1   3. Attention deficit hyperactivity disorder (ADHD), predominantly inattentive type  F90.0    Virtual Visit via Telephone Note     I connected with James Sloan on 10/09/20 at 10:30 AM EST by a video enabled telemedicine application and verified that I am speaking with the correct person using two identifiers.  I discussed the limitations, risks, security and privacy concerns of performing an evaluation and management service by telephone and the availability of in person appointments. I also discussed with the patient that there may be a patient responsible charge related to this service. The patient expressed understanding and agreed to proceed.   I discussed the assessment and treatment plan with the patient. The patient was provided an opportunity to ask questions and all were answered. The patient agreed with the plan and demonstrated an understanding of the instructions.   The patient was advised to call back or seek an in-person evaluation if the symptoms worsen or if the condition fails to improve as anticipated.  Patient location: home Provider location: home office History of Present Illness:  39 years old white married male. Relocated from [place].  Doing fair, have moved to [place], wife got a job there They are settling down, he has made appointment with psychiatrist in January in [place]  Attention, mood is fair, tolerating medsy vyvanse now 50mg , he takes drug holidays Modifying factor:wife and kids Activity factors : improved  Severity of depression is fair    No side effects reported   Substance Abuse  History in the last 12 months:  No.  Consequences of Substance Abuse: NA  Past Medical History:  Past Medical History:  Diagnosis Date  . Anxiety   . Depression   . GERD (gastroesophageal reflux disease)   . Hay fever   . Hypertension   . Lumbar herniated disc     Past Surgical History:  Procedure Laterality Date  . APPENDECTOMY    . KNEE SURGERY Left   . SHOULDER ARTHROCENTESIS Left   . WRIST SURGERY Left 01/11/2019   [facility]     Family Psychiatric History: denies   Family History:  Family History  Problem Relation Age of Onset  . Hyperlipidemia Father   . Cancer Maternal Grandmother   . ADD / ADHD Maternal Grandmother   . Cancer Maternal Grandfather   . ADD / ADHD Maternal Grandfather   . Hyperlipidemia Paternal Grandmother   . Stroke Paternal Grandfather   . Hyperlipidemia Paternal Grandfather   . Colon polyps Paternal Grandfather        Numerous  . Colon cancer Neg Hx   . Esophageal cancer Neg Hx   . Stomach cancer Neg Hx   . Rectal cancer Neg Hx     Social History:   Social History   Socioeconomic History  . Marital status: Married    Spouse name: wittiney  . Number of children: 2  . Years of education: Not on file  . Highest education level: Bachelor's degree (e.g., BA, AB, BS)  Occupational History  . Not on file  Tobacco Use  . Smoking status: Never Smoker  . Smokeless tobacco: Never Used  Vaping Use  .  Vaping Use: Never used  Substance and Sexual Activity  . Alcohol use: Yes    Alcohol/week: 2.0 - 4.0 standard drinks    Types: 1 - 2 Glasses of wine, 1 - 2 Cans of beer per week    Comment: a few drinks per week  . Drug use: Not Currently    Types: Marijuana  . Sexual activity: Yes    Partners: Female  Other Topics Concern  . Not on file  Social History Narrative  . Not on file   Social Determinants of Health   Financial Resource Strain:   . Difficulty of Paying Living Expenses: Not on file  Food Insecurity:   . Worried About  Charity fundraiser in the Last Year: Not on file  . Ran Out of Food in the Last Year: Not on file  Transportation Needs:   . Lack of Transportation (Medical): Not on file  . Lack of Transportation (Non-Medical): Not on file  Physical Activity:   . Days of Exercise per Week: Not on file  . Minutes of Exercise per Session: Not on file  Stress:   . Feeling of Stress : Not on file  Social Connections:   . Frequency of Communication with Friends and Family: Not on file  . Frequency of Social Gatherings with Friends and Family: Not on file  . Attends Religious Services: Not on file  . Active Member of Clubs or Organizations: Not on file  . Attends Archivist Meetings: Not on file  . Marital Status: Not on file    Additional Social History: grew up with parents. Fine . No trauma. Married for 9 years   Allergies:   Allergies  Allergen Reactions  . Iodinated Diagnostic Agents Hives  . Iodine Hives    Metabolic Disorder Labs: Lab Results  Component Value Date   HGBA1C 5.1 08/09/2018   No results found for: PROLACTIN Lab Results  Component Value Date   CHOL 196 08/09/2018   TRIG 197.0 (H) 08/09/2018   HDL 42.80 08/09/2018   CHOLHDL 5 08/09/2018   VLDL 39.4 08/09/2018   LDLCALC 114 (H) 08/09/2018     Current Medications: Current Outpatient Medications  Medication Sig Dispense Refill  . pantoprazole (PROTONIX) 40 MG tablet TAKE 1 TABLET BY MOUTH EVERY DAY 90 tablet 0  . buPROPion (WELLBUTRIN XL) 300 MG 24 hr tablet Take 1 tablet (300 mg total) by mouth daily. 90 tablet 0  . CANNABIDIOL PO Take 15 mg by mouth 1 day or 1 dose.    . cetirizine (ZYRTEC) 10 MG tablet TAKE 1 TABLET BY MOUTH EVERY DAY 90 tablet 3  . fluticasone (FLONASE) 50 MCG/ACT nasal spray PLACE 1 SPRAY INTO BOTH NOSTRILS DAILY. 16 mL 5  . hydrOXYzine (VISTARIL) 25 MG capsule Take 1 capsule (25 mg total) by mouth 3 (three) times daily as needed. 30 capsule 0  . lisdexamfetamine (VYVANSE) 50 MG capsule  Take 1 capsule (50 mg total) by mouth every morning. 30 capsule 0  . losartan (COZAAR) 100 MG tablet Take 1 tablet (100 mg total) by mouth daily. 30 tablet 0  . sertraline (ZOLOFT) 50 MG tablet Take 1 tablet (50 mg total) by mouth daily. 90 tablet 0  . sucralfate (CARAFATE) 1 g tablet Slowly dissolve 1 tablet in 1 tablespoon of distilled water. Do not crush. Drink "slurry" of dissolved tablet every 8 hours as needed 60 tablet 1  . traZODone (DESYREL) 50 MG tablet Take at night Delete prior refill 90  tablet 0   Current Facility-Administered Medications  Medication Dose Route Frequency Provider Last Rate Last Admin  . 0.9 %  sodium chloride infusion  500 mL Intravenous Once Armbruster, Carlota Raspberry, MD          Psychiatric Specialty Exam: Review of Systems  Cardiovascular: Negative for chest pain and palpitations.  Skin: Negative for rash.  Psychiatric/Behavioral: Negative for depression.    There were no vitals taken for this visit.There is no height or weight on file to calculate BMI.  General Appearance:  Eye Contact:   Speech:  Slow  Volume:  Normal  Mood: fair  Affect:  Congruent  Thought Process:  Goal Directed  Orientation:  Full (Time, Place, and Person)  Thought Content:  Rumination  Suicidal Thoughts:  No  Homicidal Thoughts:  No  Memory:  Immediate;   Fair Recent;   Fair  Judgement:  Good  Insight:  Good  Psychomotor Activity:  Normal  Concentration:  Concentration: Fair and Attention Span: Fair  Recall:  [employer] of Knowledge:Fair  Language: Fair  Akathisia:  Negative  Handed:  Right  AIMS (if indicated):    Assets:  Desire for Improvement  ADL's:  Intact  Cognition: WNL  Sleep:  Fair on meds    Treatment Plan Summary: Medication management and Plan as follows  1. Major depressive disorder, in remission.  Stable, given 90 days of zoloft, wellbutrin  2. SEL:TRVUYEBXID, continue zoloft 3. Insomnia: takes trazadone, will continue   4. ADHD; diagnosed by  psychological testing in college. Can call for refill till January, has appointment with local psychaitrist after that. He can be discharged from our clinic   Non face to face time spent 73min or less  .Merian Capron, MD 11/9/202110:40 AM

## 2020-11-09 ENCOUNTER — Telehealth (HOSPITAL_COMMUNITY): Payer: Self-pay

## 2020-11-09 NOTE — Telephone Encounter (Signed)
Patient needs a refill on Vyvanse

## 2020-11-09 NOTE — Telephone Encounter (Signed)
Which pharmacy?

## 2020-11-13 MED ORDER — LISDEXAMFETAMINE DIMESYLATE 50 MG PO CAPS: 50.0000 mg | ORAL_CAPSULE | ORAL | 0 refills | Status: AC

## 2020-11-13 NOTE — Telephone Encounter (Signed)
sent 

## 2020-11-13 NOTE — Addendum Note (Signed)
Addended by: Merian Capron on: 11/13/2020 10:18 AM   Modules accepted: Orders

## 2020-11-13 NOTE — Telephone Encounter (Signed)
Please send Vyvanse to [facility] in [facility]

## 2021-06-05 ENCOUNTER — Encounter
Admit: 2021-06-05 | Discharge: 2021-06-05 | Payer: Private Health Insurance - Indemnity | Primary: Student in an Organized Health Care Education/Training Program

## 2021-06-05 NOTE — Telephone Encounter
LVM for pt to schedule an appt. 06/05/21 RM

## 2021-09-06 ENCOUNTER — Encounter
Admit: 2021-09-06 | Discharge: 2021-09-06 | Payer: Private Health Insurance - Indemnity | Primary: Student in an Organized Health Care Education/Training Program

## 2024-01-07 ENCOUNTER — Encounter
Admit: 2024-01-07 | Discharge: 2024-01-07 | Payer: Private Health Insurance - Indemnity | Primary: Student in an Organized Health Care Education/Training Program
